# Patient Record
Sex: Male | Born: 1945 | Race: White | Hispanic: No | Marital: Married | Smoking: Former smoker
Health system: Southern US, Community
[De-identification: ages and names within clinical notes are randomized; demographics above are authoritative.]

## PROBLEM LIST (undated history)

## (undated) DIAGNOSIS — Z973 Presence of spectacles and contact lenses: Secondary | ICD-10-CM

## (undated) DIAGNOSIS — C61 Malignant neoplasm of prostate: Secondary | ICD-10-CM

## (undated) DIAGNOSIS — M1990 Unspecified osteoarthritis, unspecified site: Secondary | ICD-10-CM

## (undated) DIAGNOSIS — C4490 Unspecified malignant neoplasm of skin, unspecified: Secondary | ICD-10-CM

## (undated) DIAGNOSIS — J449 Chronic obstructive pulmonary disease, unspecified: Secondary | ICD-10-CM

## (undated) DIAGNOSIS — L4050 Arthropathic psoriasis, unspecified: Secondary | ICD-10-CM

## (undated) DIAGNOSIS — H269 Unspecified cataract: Secondary | ICD-10-CM

## (undated) DIAGNOSIS — Z974 Presence of external hearing-aid: Secondary | ICD-10-CM

## (undated) DIAGNOSIS — C801 Malignant (primary) neoplasm, unspecified: Secondary | ICD-10-CM

## (undated) HISTORY — PX: PROSTATE BIOPSY: SHX241

## (undated) HISTORY — PX: TONSILLECTOMY: SUR1361

## (undated) HISTORY — PX: POLYPECTOMY: SHX149

## (undated) HISTORY — PX: CATARACT EXTRACTION: SUR2

## (undated) HISTORY — DX: Arthropathic psoriasis, unspecified: L40.50

## (undated) HISTORY — DX: Malignant (primary) neoplasm, unspecified: C80.1

## (undated) HISTORY — DX: Unspecified cataract: H26.9

## (undated) HISTORY — DX: Chronic obstructive pulmonary disease, unspecified: J44.9

## (undated) HISTORY — PX: SKIN CANCER EXCISION: SHX779

## (undated) HISTORY — PX: COLONOSCOPY: SHX174

## (undated) HISTORY — DX: Unspecified osteoarthritis, unspecified site: M19.90

---

## 2014-07-08 HISTORY — PX: POLYPECTOMY: SHX149

## 2014-11-25 ENCOUNTER — Telehealth: Payer: Self-pay | Admitting: Internal Medicine

## 2014-12-07 ENCOUNTER — Encounter: Payer: Self-pay | Admitting: Internal Medicine

## 2015-01-24 ENCOUNTER — Ambulatory Visit (AMBULATORY_SURGERY_CENTER): Payer: Self-pay | Admitting: *Deleted

## 2015-01-24 VITALS — Ht 70.0 in | Wt 182.4 lb

## 2015-01-24 DIAGNOSIS — Z86010 Personal history of colonic polyps: Secondary | ICD-10-CM

## 2015-01-24 MED ORDER — NA SULFATE-K SULFATE-MG SULF 17.5-3.13-1.6 GM/177ML PO SOLN: 1.0000 | Freq: Once | ORAL | Status: DC

## 2015-01-24 NOTE — Progress Notes (Signed)
No egg or soy allergy No issues with past sedation No home 02 No diet pills emmi declined Report to : [place] [address] [place]   Dr Othelia Pulling   [phone]

## 2015-02-07 ENCOUNTER — Ambulatory Visit (AMBULATORY_SURGERY_CENTER): Payer: Medicare Other | Admitting: Internal Medicine

## 2015-02-07 ENCOUNTER — Encounter: Payer: Self-pay | Admitting: Internal Medicine

## 2015-02-07 VITALS — BP 107/71 | HR 70 | Temp 97.2°F | Resp 20 | Ht 70.0 in | Wt 182.0 lb

## 2015-02-07 DIAGNOSIS — D129 Benign neoplasm of anus and anal canal: Secondary | ICD-10-CM

## 2015-02-07 DIAGNOSIS — D122 Benign neoplasm of ascending colon: Secondary | ICD-10-CM | POA: Diagnosis not present

## 2015-02-07 DIAGNOSIS — D128 Benign neoplasm of rectum: Secondary | ICD-10-CM

## 2015-02-07 DIAGNOSIS — Z86010 Personal history of colonic polyps: Secondary | ICD-10-CM | POA: Diagnosis present

## 2015-02-07 MED ORDER — SODIUM CHLORIDE 0.9 % IV SOLN: 500.0000 mL | INTRAVENOUS | Status: DC

## 2015-02-07 NOTE — Patient Instructions (Signed)
Discharge instructions given. Handouts on polyps and diverticulosis. Resume previous medications. YOU HAD AN ENDOSCOPIC PROCEDURE TODAY AT THE [facility]:   Refer to the procedure report that was given to you for any specific questions about what was found during the examination.  If the procedure report does not answer your questions, please call your gastroenterologist to clarify.  If you requested that your care partner not be given the details of your procedure findings, then the procedure report has been included in a sealed envelope for you to review at your convenience later.  YOU SHOULD EXPECT: Some feelings of bloating in the abdomen. Passage of more gas than usual.  Walking can help get rid of the air that was put into your GI tract during the procedure and reduce the bloating. If you had a lower endoscopy (such as a colonoscopy or flexible sigmoidoscopy) you may notice spotting of blood in your stool or on the toilet paper. If you underwent a bowel prep for your procedure, you may not have a normal bowel movement for a few days.  Please Note:  You might notice some irritation and congestion in your nose or some drainage.  This is from the oxygen used during your procedure.  There is no need for concern and it should clear up in a day or so.  SYMPTOMS TO REPORT IMMEDIATELY:   Following lower endoscopy (colonoscopy or flexible sigmoidoscopy):  Excessive amounts of blood in the stool  Significant tenderness or worsening of abdominal pains  Swelling of the abdomen that is new, acute  Fever of 100F or higher   For urgent or emergent issues, a gastroenterologist can be reached at any hour by calling [phone].   DIET: Your first meal following the procedure should be a small meal and then it is ok to progress to your normal diet. Heavy or fried foods are harder to digest and may make you feel nauseous or bloated.  Likewise, meals heavy in dairy and vegetables can  increase bloating.  Drink plenty of fluids but you should avoid alcoholic beverages for 24 hours.  ACTIVITY:  You should plan to take it easy for the rest of today and you should NOT DRIVE or use heavy machinery until tomorrow (because of the sedation medicines used during the test).    FOLLOW UP: Our staff will call the number listed on your records the next business day following your procedure to check on you and address any questions or concerns that you may have regarding the information given to you following your procedure. If we do not reach you, we will leave a message.  However, if you are feeling well and you are not experiencing any problems, there is no need to return our call.  We will assume that you have returned to your regular daily activities without incident.  If any biopsies were taken you will be contacted by phone or by letter within the next 1-3 weeks.  Please call us at [phone] if you have not heard about the biopsies in 3 weeks.    SIGNATURES/CONFIDENTIALITY: You and/or your care partner have signed paperwork which will be entered into your electronic medical record.  These signatures attest to the fact that that the information above on your After Visit Summary has been reviewed and is understood.  Full responsibility of the confidentiality of this discharge information lies with you and/or your care-partner. 

## 2015-02-07 NOTE — Progress Notes (Signed)
Transferred to recovery room. A/O x3, pleased with MAC.  VSS.  Report to [place], Therapist, sports.

## 2015-02-07 NOTE — Progress Notes (Signed)
Called to room to assist during endoscopic procedure.  Patient ID and intended procedure confirmed with present staff. Received instructions for my participation in the procedure from the performing physician.  

## 2015-02-07 NOTE — Op Note (Signed)
[place]  [address] [place], [address]   COLONOSCOPY PROCEDURE REPORT  PATIENT: Rodney Martin, Rodney Martin  MR#: [id number] BIRTHDATE: 10-15-1945 , 89  yrs. old GENDER: male ENDOSCOPIST: Eustace Quail, MD REFERRED BY:.Direct Self PROCEDURE DATE:  02/07/2015 PROCEDURE:   Colonoscopy, surveillance and Colonoscopy with snare polypectomy x 6 First Screening Colonoscopy - Avg.  risk and is 50 yrs.  old or older Yes.  Prior Negative Screening - Now for repeat screening. N/A  History of Adenoma - Now for follow-up colonoscopy & has been > or = to 3 yrs.  Yes hx of adenoma.  Has been 3 or more years since last colonoscopy.  Polyps removed today? Yes ASA CLASS:   Class III INDICATIONS:Surveillance due to prior colonic neoplasia and PH Colon Adenoma. Multiple prior colonoscopies (Dr. Algis Greenhouse, [place]) with most recent examinations in 2006 and 2011 with small tubular adenomas. MEDICATIONS: Monitored anesthesia care and Propofol 300 mg IV  DESCRIPTION OF PROCEDURE:   After the risks benefits and alternatives of the procedure were thoroughly explained, informed consent was obtained.  The digital rectal exam revealed no abnormalities of the rectum.   The LB [id number]  endoscope was introduced through the anus and advanced to the cecum, which was identified by both the appendix and ileocecal valve. No adverse events experienced.   The quality of the prep was excellent. (MiraLax was used)  The instrument was then slowly withdrawn as the colon was fully examined. Estimated blood loss is zero unless otherwise noted in this procedure report.  COLON FINDINGS: Six polyps ranging between 2-57mm in size were found in the rectum and ascending colon.  A polypectomy was performed with a cold snare.  The resection was complete, the polyp tissue was completely retrieved and sent to histology.   There was moderate diverticulosis noted in the left colon.   The examination was  otherwise normal.  Retroflexed views revealed internal hemorrhoids. The time to cecum = 2.3 Withdrawal time = 11.6   The scope was withdrawn and the procedure completed. COMPLICATIONS: There were no immediate complications.  ENDOSCOPIC IMPRESSION: 1.   Six polyps were found in the rectum and ascending colon; polypectomy was performed with a cold snare 2.   Moderate diverticulosis was noted in the left colon 3.   The examination was otherwise normal  RECOMMENDATIONS: 1. Repeat Colonoscopy in 3 years.  eSigned:  Eustace Quail, MD 02/07/2015 10:25 AM   cc: The Patient

## 2015-02-08 ENCOUNTER — Telehealth: Payer: Self-pay | Admitting: *Deleted

## 2015-02-08 NOTE — Telephone Encounter (Signed)
  Follow up Call-  Call back number 02/07/2015  Post procedure Call Back phone  # [phone]  Permission to leave phone message Yes     Patient questions:  Do you have a fever, pain , or abdominal swelling? No. Pain Score  0 *0  Have you tolerated food without any problems? Yes.    Have you been able to return to your normal activities? Yes.    Do you have any questions about your discharge instructions: Diet   No. Medications             NO Follow up visit  No.  Do you have questions or concerns about your Care? No.  Actions: * If pain score is 4 or above: No action needed, pain <4.

## 2015-02-21 ENCOUNTER — Encounter: Payer: Self-pay | Admitting: Internal Medicine

## 2015-02-27 ENCOUNTER — Telehealth: Payer: Self-pay | Admitting: Internal Medicine

## 2015-02-27 NOTE — Telephone Encounter (Signed)
Results letter reviewed with pts wife.

## 2015-04-24 NOTE — Telephone Encounter (Signed)
Appt. Scheduled.

## 2018-01-14 ENCOUNTER — Encounter: Payer: Self-pay | Admitting: Internal Medicine

## 2018-03-10 ENCOUNTER — Ambulatory Visit (AMBULATORY_SURGERY_CENTER): Payer: Self-pay

## 2018-03-10 ENCOUNTER — Other Ambulatory Visit: Payer: Self-pay

## 2018-03-10 ENCOUNTER — Encounter: Payer: Self-pay | Admitting: Internal Medicine

## 2018-03-10 VITALS — Ht 70.0 in | Wt 181.0 lb

## 2018-03-10 DIAGNOSIS — Z86010 Personal history of colonic polyps: Secondary | ICD-10-CM

## 2018-03-10 MED ORDER — NA SULFATE-K SULFATE-MG SULF 17.5-3.13-1.6 GM/177ML PO SOLN: 1.0000 | Freq: Once | ORAL | 0 refills | Status: AC

## 2018-03-10 NOTE — Progress Notes (Signed)
No egg or soy allergy known to patient  No issues with past sedation with any surgeries  or procedures, no intubation problems  No diet pills per patient No home 02 use per patient  No blood thinners per patient  Pt denies issues with constipation  No A fib or A flutter  EMMI video sent to pt's e mail , pt declined    

## 2018-03-23 ENCOUNTER — Ambulatory Visit (AMBULATORY_SURGERY_CENTER): Payer: Medicare Other | Admitting: Internal Medicine

## 2018-03-23 ENCOUNTER — Encounter: Payer: Self-pay | Admitting: Internal Medicine

## 2018-03-23 VITALS — BP 90/51 | HR 72 | Temp 98.6°F | Resp 15 | Ht 70.0 in | Wt 181.0 lb

## 2018-03-23 DIAGNOSIS — Z86010 Personal history of colonic polyps: Secondary | ICD-10-CM | POA: Diagnosis present

## 2018-03-23 DIAGNOSIS — D122 Benign neoplasm of ascending colon: Secondary | ICD-10-CM

## 2018-03-23 MED ORDER — SODIUM CHLORIDE 0.9 % IV SOLN: 500.0000 mL | Freq: Once | INTRAVENOUS | Status: DC

## 2018-03-23 NOTE — Progress Notes (Signed)
Report to PACU, RN, vss, BBS= Clear.  

## 2018-03-23 NOTE — Progress Notes (Signed)
Pt. Reports no change in his medical or surgical history since his pre-visit 03/10/2018.

## 2018-03-23 NOTE — Patient Instructions (Signed)
YOU HAD AN ENDOSCOPIC PROCEDURE TODAY AT THE [facility]:   Refer to the procedure report that was given to you for any specific questions about what was found during the examination.  If the procedure report does not answer your questions, please call your gastroenterologist to clarify.  If you requested that your care partner not be given the details of your procedure findings, then the procedure report has been included in a sealed envelope for you to review at your convenience later.  YOU SHOULD EXPECT: Some feelings of bloating in the abdomen. Passage of more gas than usual.  Walking can help get rid of the air that was put into your GI tract during the procedure and reduce the bloating. If you had a lower endoscopy (such as a colonoscopy or flexible sigmoidoscopy) you may notice spotting of blood in your stool or on the toilet paper. If you underwent a bowel prep for your procedure, you may not have a normal bowel movement for a few days.  Please Note:  You might notice some irritation and congestion in your nose or some drainage.  This is from the oxygen used during your procedure.  There is no need for concern and it should clear up in a day or so.  SYMPTOMS TO REPORT IMMEDIATELY:   Following lower endoscopy (colonoscopy or flexible sigmoidoscopy):  Excessive amounts of blood in the stool  Significant tenderness or worsening of abdominal pains  Swelling of the abdomen that is new, acute  Fever of 100F or higher  For urgent or emergent issues, a gastroenterologist can be reached at any hour by calling [phone].   DIET:  We do recommend a small meal at first, but then you may proceed to your regular diet.  Drink plenty of fluids but you should avoid alcoholic beverages for 24 hours.  MEDICATIONS: Continue present medications.  Please see handouts given to you by your recovery nurse.  ACTIVITY:  You should plan to take it easy for the rest of today and you should NOT  DRIVE or use heavy machinery until tomorrow (because of the sedation medicines used during the test).    FOLLOW UP: Our staff will call the number listed on your records the next business day following your procedure to check on you and address any questions or concerns that you may have regarding the information given to you following your procedure. If we do not reach you, we will leave a message.  However, if you are feeling well and you are not experiencing any problems, there is no need to return our call.  We will assume that you have returned to your regular daily activities without incident.  If any biopsies were taken you will be contacted by phone or by letter within the next 1-3 weeks.  Please call us at [phone] if you have not heard about the biopsies in 3 weeks.   Thank you for allowing us to provide for your healthcare needs today.   SIGNATURES/CONFIDENTIALITY: You and/or your care partner have signed paperwork which will be entered into your electronic medical record.  These signatures attest to the fact that that the information above on your After Visit Summary has been reviewed and is understood.  Full responsibility of the confidentiality of this discharge information lies with you and/or your care-partner. 

## 2018-03-23 NOTE — Op Note (Signed)
[facility] Patient Name: Rodney Martin Procedure Date: 03/23/2018 8:01 AM MRN: [id number] Endoscopist: Docia Chuck. Henrene Pastor , MD Age: 72 Referring MD:  Date of Birth: 08-19-1945 Gender: Male Account #: [id number] Procedure:                Colonoscopy, With cold snare polypectomy x 1 Indications:              High risk colon cancer surveillance: Personal                            history of multiple (3 or more) adenomas. Previous                            examinations 2006 and 2011 (elsewhere) and 2016 Medicines:                Monitored Anesthesia Care Procedure:                Pre-Anesthesia Assessment:                           - Prior to the procedure, a History and Physical                            was performed, and patient medications and                            allergies were reviewed. The patient's tolerance of                            previous anesthesia was also reviewed. The risks                            and benefits of the procedure and the sedation                            options and risks were discussed with the patient.                            All questions were answered, and informed consent                            was obtained. Prior Anticoagulants: The patient has                            taken no previous anticoagulant or antiplatelet                            agents. ASA Grade Assessment: II - A patient with                            mild systemic disease. After reviewing the risks                            and benefits, the patient was deemed in  satisfactory condition to undergo the procedure.                           After obtaining informed consent, the colonoscope                            was passed under direct vision. Throughout the                            procedure, the patient's blood pressure, pulse, and                            oxygen saturations were monitored continuously. The                Colonoscope was introduced through the anus and                            advanced to the the cecum, identified by                            appendiceal orifice and ileocecal valve. The                            ileocecal valve, appendiceal orifice, and rectum                            were photographed. The quality of the bowel                            preparation was excellent. The colonoscopy was                            performed without difficulty. The patient tolerated                            the procedure well. The bowel preparation used was                            SUPREP. Scope In: 8:11:48 AM Scope Out: 8:23:57 AM Scope Withdrawal Time: 0 hours 9 minutes 37 seconds  Total Procedure Duration: 0 hours 12 minutes 9 seconds  Findings:                 A 3 mm polyp was found in the ascending colon. The                            polyp was removed with a cold snare. Resection and                            retrieval were complete.                           Multiple diverticula were found in the left colon.  Internal hemorrhoids were found during retroflexion.                           The exam was otherwise without abnormality on                            direct and retroflexion views. Complications:            No immediate complications. Estimated blood loss:                            None. Estimated Blood Loss:     Estimated blood loss: none. Impression:               - One 3 mm polyp in the ascending colon, removed                            with a cold snare. Resected and retrieved.                           - Diverticulosis in the left colon.                           - Internal hemorrhoids.                           - The examination was otherwise normal on direct                            and retroflexion views. Recommendation:           - Repeat colonoscopy in 5 years for surveillance.                           - Patient  has a contact number available for                            emergencies. The signs and symptoms of potential                            delayed complications were discussed with the                            patient. Return to normal activities tomorrow.                            Written discharge instructions were provided to the                            patient.                           - Resume previous diet.                           - Continue present medications.                           -  Await pathology results. Docia Chuck. Henrene Pastor, MD 03/23/2018 8:38:00 AM This report has been signed electronically.

## 2018-03-23 NOTE — Progress Notes (Signed)
Called to room to assist during endoscopic procedure.  Patient ID and intended procedure confirmed with present staff. Received instructions for my participation in the procedure from the performing physician.  

## 2018-03-24 ENCOUNTER — Telehealth: Payer: Self-pay | Admitting: *Deleted

## 2018-03-24 NOTE — Telephone Encounter (Signed)
  Follow up Call-  Call back number 03/23/2018  Post procedure Call Back phone  # #[phone] home  Permission to leave phone message Yes  Some recent data might be hidden     Patient questions:  Do you have a fever, pain , or abdominal swelling? No. Pain Score  0 *  Have you tolerated food without any problems? Yes.    Have you been able to return to your normal activities? Yes.    Do you have any questions about your discharge instructions: Diet   No. Medications  No. Follow up visit  No.  Do you have questions or concerns about your Care? No.  Actions: * If pain score is 4 or above: No action needed, pain <4.

## 2018-03-30 ENCOUNTER — Encounter: Payer: Self-pay | Admitting: Internal Medicine

## 2020-07-03 NOTE — Progress Notes (Signed)
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 Marcine Matar Transrectal ultrasound of prostate with prostate needle biopsy  Past/Anticipated interventions by medical oncology, if any:  No referral placed at this time  Weight changes, if any: denies  Bowel/Bladder complaints, if any: IPSS 5. SHIM 1 due to no sexual activity. Denies dysuria, hematuria, urinary leakage or incontinence.    Nausea/Vomiting, if any: denies  Pain issues, if any:  denies  SAFETY ISSUES:  Prior radiation? denies  Pacemaker/ICD? No  Possible current pregnancy? N/A  Is the patient on methotrexate? denies  Current Complaints / other details: 74 year old male. Strong family hx of cancer.  History of skin cancer

## 2020-07-04 ENCOUNTER — Other Ambulatory Visit: Payer: Self-pay

## 2020-07-04 ENCOUNTER — Encounter: Payer: Self-pay | Admitting: Radiation Oncology

## 2020-07-04 ENCOUNTER — Ambulatory Visit
Admission: RE | Admit: 2020-07-04 | Discharge: 2020-07-04 | Disposition: A | Discharge status: Home / Self Care | Payer: Medicare Other | Source: Ambulatory Visit | Attending: Radiation Oncology | Admitting: Radiation Oncology

## 2020-07-04 ENCOUNTER — Encounter: Payer: Self-pay | Admitting: Medical Oncology

## 2020-07-04 VITALS — BP 116/77 | HR 91 | Temp 97.6°F | Resp 18 | Ht 70.0 in | Wt 194.4 lb

## 2020-07-04 DIAGNOSIS — Z87891 Personal history of nicotine dependence: Secondary | ICD-10-CM | POA: Insufficient documentation

## 2020-07-04 DIAGNOSIS — J449 Chronic obstructive pulmonary disease, unspecified: Secondary | ICD-10-CM | POA: Insufficient documentation

## 2020-07-04 DIAGNOSIS — Z800 Family history of malignant neoplasm of digestive organs: Secondary | ICD-10-CM | POA: Diagnosis not present

## 2020-07-04 DIAGNOSIS — C61 Malignant neoplasm of prostate: Secondary | ICD-10-CM | POA: Diagnosis not present

## 2020-07-04 DIAGNOSIS — M129 Arthropathy, unspecified: Secondary | ICD-10-CM | POA: Diagnosis not present

## 2020-07-04 DIAGNOSIS — Z803 Family history of malignant neoplasm of breast: Secondary | ICD-10-CM | POA: Diagnosis not present

## 2020-07-04 HISTORY — DX: Malignant neoplasm of prostate: C61

## 2020-07-04 HISTORY — DX: Unspecified malignant neoplasm of skin, unspecified: C44.90

## 2020-07-04 NOTE — Progress Notes (Signed)
See progress note under physician encounter. 

## 2020-07-04 NOTE — Progress Notes (Signed)
Radiation Oncology         [phone] ________________________________  Initial outpatient Consultation  Name: Rodney Martin MRN: [id number]  Date: 07/04/2020  DOB: 1946-04-12  XB:[id number], Tilden Fossa, MD  Franchot Gallo, MD   REFERRING PHYSICIAN: Franchot Gallo, MD  DIAGNOSIS: 74 y.o. gentleman with Stage T1c adenocarcinoma of the prostate with Gleason score of 3+4, and PSA of 5.87.    ICD-10-CM   1. Malignant neoplasm of prostate ([place])  C61     HISTORY OF PRESENT ILLNESS: Rodney Martin is a 74 y.o. male with a diagnosis of prostate cancer. He was noted to have an elevated PSA of 5.02 by his primary care physician, Dr. Shon Millet.  Accordingly, he was referred for evaluation in urology by Dr. Diona Fanti on 05/01/2020,  digital rectal examination was performed at that time revealing no nodules.  Repeat PSA from that day remained elevated at 5.87.  The patient proceeded to transrectal ultrasound with 12 biopsies of the prostate on 05/22/2020.  The prostate volume measured 34.44 cc.  Out of 12 core biopsies, 2 were positive.  The maximum Gleason score was 3+4, and this was seen in the left base lateral (with cribiform glands) and left mid.  The patient reviewed the biopsy results with his urologist and he has kindly been referred today for discussion of potential radiation treatment options.   PREVIOUS RADIATION THERAPY: No  PAST MEDICAL HISTORY:  Past Medical History:  Diagnosis Date   Arthritis    Cataract    bilateral and removed   COPD (chronic obstructive pulmonary disease) (HCC)    Prostate cancer (HCC)    Psoriatic arthritis ([place])    Skin cancer       PAST SURGICAL HISTORY: Past Surgical History:  Procedure Laterality Date   CATARACT EXTRACTION Bilateral    COLONOSCOPY     POLYPECTOMY     PROSTATE BIOPSY     SKIN CANCER EXCISION     multiple sites   TONSILLECTOMY      FAMILY HISTORY:  Family History  Problem Relation Age of Onset   Colon  cancer Brother    Breast cancer Sister    Pancreatic cancer Brother    Colon polyps Neg Hx    Rectal cancer Neg Hx    Stomach cancer Neg Hx    Esophageal cancer Neg Hx     SOCIAL HISTORY:  Social History   Socioeconomic History   Marital status: Married    Spouse name: Not on file   Number of children: Not on file   Years of education: Not on file   Highest education level: Not on file  Occupational History   Not on file  Tobacco Use   Smoking status: Former Smoker    Packs/day: 1.00    Types: Cigarettes    Quit date: 08/2018    Years since quitting: 1.9   Smokeless tobacco: Never Used  Vaping Use   Vaping Use: Never used  Substance and Sexual Activity   Alcohol use: No    Alcohol/week: 0.0 standard drinks   Drug use: No   Sexual activity: Not Currently  Other Topics Concern   Not on file  Social History Narrative   Not on file   Social Determinants of Health   Financial Resource Strain: Not on file  Food Insecurity: Not on file  Transportation Needs: Not on file  Physical Activity: Not on file  Stress: Not on file  Social Connections: Not on file  Intimate Partner Violence:  Not on file    ALLERGIES: Remicade [infliximab], Aspirin, Humira [adalimumab], Bacitracin-polymyxin b, and Naproxen  MEDICATIONS:  Current Outpatient Medications  Medication Sig Dispense Refill   calcium carbonate (OS-CAL) 600 MG TABS tablet Take 600 mg by mouth 2 (two) times daily with a meal.     cholecalciferol (VITAMIN D) 1000 UNITS tablet Take 2,000 Units by mouth daily.     denosumab (PROLIA) 60 MG/ML SOSY injection Inject 60 mg into the skin every 6 (six) months.     Glucosamine-Chondroitin 1500-1200 MG/30ML LIQD      Multiple Vitamin (MULTIVITAMIN) tablet Take 1 tablet by mouth daily.     Omega-3 Fatty Acids (FISH OIL) 1000 MG CAPS Take by mouth.     pimecrolimus (ELIDEL) 1 % cream Apply topically.     ustekinumab (STELARA) 45 MG/0.5ML SOSY injection  Inject 45 mg into the skin. Every 12 weeks     augmented betamethasone dipropionate (DIPROLENE-AF) 0.05 % cream  (Patient not taking: Reported on 07/04/2020)     Current Facility-Administered Medications  Medication Dose Route Frequency Provider Last Rate Last Admin   0.9 %  sodium chloride infusion  500 mL Intravenous Once Irene Shipper, MD        REVIEW OF SYSTEMS:  On review of systems, the patient reports that he is doing well overall. He denies any chest pain, shortness of breath, cough, fevers, chills, night sweats, unintended weight changes. He denies any bowel disturbances, and denies abdominal pain, nausea or vomiting. He denies any new musculoskeletal or joint aches or pains. His IPSS was 5, indicating mild urinary symptoms. His SHIM was 1 due to inactivity, indicating he may or may not have erectile dysfunction. A complete review of systems is obtained and is otherwise negative.    PHYSICAL EXAM:  Wt Readings from Last 3 Encounters:  07/04/20 194 lb 6.4 oz (88.2 kg)  03/23/18 181 lb (82.1 kg)  03/10/18 181 lb (82.1 kg)   Temp Readings from Last 3 Encounters:  07/04/20 97.6 F (36.4 C)  03/23/18 98.6 F (37 C)  02/07/15 97.2 F (36.2 C) (Tympanic)   BP Readings from Last 3 Encounters:  07/04/20 116/77  03/23/18 (!) 90/51  02/07/15 107/71   Pulse Readings from Last 3 Encounters:  07/04/20 91  03/23/18 72  02/07/15 70    /10  In general this is a well appearing Caucasian man in no acute distress. He's alert and oriented x4 and appropriate throughout the examination. Cardiopulmonary assessment is negative for acute distress, and he exhibits normal effort.     KPS = 90  100 - Normal; no complaints; no evidence of disease. 90   - Able to carry on normal activity; minor signs or symptoms of disease. 80   - Normal activity with effort; some signs or symptoms of disease. 67   - Cares for self; unable to carry on normal activity or to do active work. 60   - Requires  occasional assistance, but is able to care for most of his personal needs. 50   - Requires considerable assistance and frequent medical care. 41   - Disabled; requires special care and assistance. 46   - Severely disabled; hospital admission is indicated although death not imminent. 100   - Very sick; hospital admission necessary; active supportive treatment necessary. 10   - Moribund; fatal processes progressing rapidly. 0     - Dead  Karnofsky DA, Abelmann WH, Craver LS and Burchenal [facility] [phone]) The use of the nitrogen  mustards in the palliative treatment of carcinoma: with particular reference to bronchogenic carcinoma Cancer 1 [phone]  LABORATORY DATA:  No results found for: WBC, HGB, HCT, MCV, PLT No results found for: NA, K, CL, CO2 No results found for: ALT, AST, GGT, ALKPHOS, BILITOT   RADIOGRAPHY: No results found.    IMPRESSION/PLAN: 1. 74 y.o. gentleman with Stage T1c adenocarcinoma of the prostate with Gleason Score of 3+4, and PSA of 5.87. We discussed the patient's workup and outlined the nature of prostate cancer in this setting. The patient's T stage, Gleason's score, and PSA put him into the favorable intermediate risk group. Accordingly, he is eligible for a variety of potential treatment options including brachytherapy, 5.5 weeks of external radiation, or prostatectomy. We discussed the available radiation techniques, and focused on the details and logistics of delivery. We discussed and outlined the risks, benefits, short and long-term effects associated with radiotherapy and compared and contrasted these with prostatectomy. We discussed the role of SpaceOAR gel in reducing the rectal toxicity associated with radiotherapy.  He appears to have a good understanding of his disease and our treatment recommendations which are of curative intent.  He was encouraged to ask questions that were answered to his stated satisfaction.  At the conclusion of our conversation, the patient is  interested in moving forward with brachytherapy and use of SpaceOAR gel to reduce rectal toxicity from radiotherapy.  We will share our discussion with Dr. Diona Fanti and move forward with scheduling his CT [facility] planning appointment in the near future.  The patient met briefly with Romie Jumper in our office who will be working closely with him to coordinate OR scheduling and pre and post procedure appointments.  We will contact the pharmaceutical rep to ensure that [employer] is available at the time of procedure.  We enjoyed meeting him today and look forward to continuing to participate in his care.  Of note, he is followed by Dr. Amil Amen at [facility] rheumatology for psoriatic arthritis and states that he is due for his next [facility] injection on July 19, 2020. We assured him that this will not interfere with his brachytherapy procedure which will likely be scheduled for mid-late Feb. 2022 but that we would recommend that he skip the treatment in February and will defer to his rheumatologist regarding recommendation for resuming treatment after his seed implant procedure.    Nicholos Johns, PA-C    Tyler Pita, MD  [facility] Direct Dial: [phone]   Fax: [phone] [email or web address]   Skype   [email or web address]   This document serves as a record of services personally performed by Tyler Pita, MD and Freeman Caldron, PA-C. It was created on their behalf by Wilburn Mylar, a trained medical scribe. The creation of this record is based on the scribe's personal observations and the provider's statements to them. This document has been checked and approved by the attending provider.

## 2020-07-11 ENCOUNTER — Telehealth: Payer: Self-pay | Admitting: *Deleted

## 2020-07-11 NOTE — Telephone Encounter (Signed)
CALLED PATIENT TO UPDATE, LVM FOR A RETURN CALL 

## 2020-07-12 ENCOUNTER — Telehealth: Payer: Self-pay | Admitting: *Deleted

## 2020-07-12 NOTE — Telephone Encounter (Signed)
RETURNED PATIENT'S PHONE CALL, SPOKE WITH PATIENT. ?

## 2020-07-20 ENCOUNTER — Telehealth: Payer: Self-pay | Admitting: *Deleted

## 2020-07-20 NOTE — Telephone Encounter (Signed)
Called patient to inform of pre-seed appts. and implant, lvm for a return call 

## 2020-07-26 ENCOUNTER — Other Ambulatory Visit: Payer: Self-pay | Admitting: Urology

## 2020-08-07 ENCOUNTER — Telehealth: Payer: Self-pay | Admitting: *Deleted

## 2020-08-07 NOTE — Telephone Encounter (Signed)
RETURNED PATIENT'S PHONE CALL, SPOKE WITH PATIENT. ?

## 2020-08-09 ENCOUNTER — Telehealth: Payer: Self-pay | Admitting: *Deleted

## 2020-08-09 NOTE — Telephone Encounter (Signed)
CALLED PATIENT TO REMIND OF PRE-SEED APPTS. FOR 08-10-20, SPOKE WITH PATIENT AND HE IS AWARE OF THESE APPTS.

## 2020-08-10 ENCOUNTER — Encounter (HOSPITAL_COMMUNITY)
Admission: RE | Admit: 2020-08-10 | Discharge: 2020-08-10 | Disposition: A | Discharge status: Home / Self Care | Payer: Medicare Other | Source: Ambulatory Visit | Attending: Urology | Admitting: Urology

## 2020-08-10 ENCOUNTER — Ambulatory Visit
Admission: RE | Admit: 2020-08-10 | Discharge: 2020-08-10 | Disposition: A | Discharge status: Home / Self Care | Payer: Medicare Other | Source: Ambulatory Visit | Attending: Urology | Admitting: Urology

## 2020-08-10 ENCOUNTER — Ambulatory Visit
Admission: RE | Admit: 2020-08-10 | Discharge: 2020-08-10 | Disposition: A | Discharge status: Home / Self Care | Payer: Medicare Other | Source: Ambulatory Visit | Attending: Radiation Oncology | Admitting: Radiation Oncology

## 2020-08-10 ENCOUNTER — Ambulatory Visit (HOSPITAL_COMMUNITY)
Admission: RE | Admit: 2020-08-10 | Discharge: 2020-08-10 | Disposition: A | Discharge status: Home / Self Care | Payer: Medicare Other | Source: Ambulatory Visit | Attending: Urology | Admitting: Urology

## 2020-08-10 ENCOUNTER — Encounter: Payer: Self-pay | Admitting: Medical Oncology

## 2020-08-10 ENCOUNTER — Other Ambulatory Visit: Payer: Self-pay

## 2020-08-10 DIAGNOSIS — Z01818 Encounter for other preprocedural examination: Secondary | ICD-10-CM | POA: Diagnosis present

## 2020-08-10 DIAGNOSIS — C61 Malignant neoplasm of prostate: Secondary | ICD-10-CM | POA: Diagnosis present

## 2020-08-10 NOTE — Progress Notes (Signed)
  Radiation Oncology         [phone] ________________________________  Name: DUTCH ING MRN: [id number]  Date: 08/10/2020  DOB: Apr 05, 1946  SIMULATION AND TREATMENT PLANNING NOTE PUBIC ARCH STUDY  HW:[id number], Tilden Fossa, MD  Franchot Gallo, MD  DIAGNOSIS: 75 y.o. gentleman with Stage T1c adenocarcinoma of the prostate with Gleason score of 3+4, and PSA of 5.87.  Oncology History  Malignant neoplasm of prostate ([place])  05/22/2020 Cancer Staging   Staging form: Prostate, AJCC 8th Edition - Clinical stage from 05/22/2020: Stage IIB (cT1c, cN0, cM0, PSA: 5.9, Grade Group: 2) - Signed by Freeman Caldron, PA-C on 07/04/2020   07/04/2020 Initial Diagnosis   Malignant neoplasm of prostate ([place])       ICD-10-CM   1. Malignant neoplasm of prostate ([place])  C61     COMPLEX SIMULATION:  The patient presented today for evaluation for possible prostate seed implant. He was brought to the radiation planning suite and placed supine on the CT couch. A 3-dimensional image study set was obtained in upload to the planning computer. There, on each axial slice, I contoured the prostate gland. Then, using three-dimensional radiation planning tools I reconstructed the prostate in view of the structures from the transperineal needle pathway to assess for possible pubic arch interference. In doing so, I did not appreciate any pubic arch interference. Also, the patient's prostate volume was estimated based on the drawn structure. The volume was 35 cc.  Given the pubic arch appearance and prostate volume, patient remains a good candidate to proceed with prostate seed implant. Today, he freely provided informed written consent to proceed.    PLAN: The patient will undergo prostate seed implant.   ________________________________  Sheral Apley. Tammi Klippel, M.D.

## 2020-08-11 NOTE — Progress Notes (Signed)
Faxed chest xray results to [facility] attention selita, left voice mail message for selits staing chest xray results from 08-10-2020 faxed to medical records and fax confirmation received, please fax me a copy if dr dahlstedt orders a chest ct

## 2020-09-18 NOTE — Progress Notes (Signed)
Left voicemail message with selita to please send me any chest ct results  or notes dr Diona Fanti has concerning abnormal chest xray results faxed to dr dahlstedt on 08-10-2020.

## 2020-09-18 NOTE — Progress Notes (Addendum)
Routed chest ray results to dr Diona Fanti via epic and left message with shirley for dr Tammi Klippel to look at chest xray results

## 2020-09-19 ENCOUNTER — Other Ambulatory Visit: Payer: Self-pay

## 2020-09-19 ENCOUNTER — Encounter (HOSPITAL_BASED_OUTPATIENT_CLINIC_OR_DEPARTMENT_OTHER): Payer: Self-pay | Admitting: Urology

## 2020-09-19 NOTE — Progress Notes (Signed)
Spoke w/ via phone for pre-op interview---pt Lab needs dos----none             Lab results------has lab appointment 09-22-2020 1100 for cbc cmp pt ptt COVID test ------09-22-2020 1230 Arrive at -------900 am 09-25-2020 NPO after MN NO Solid Food.  Clear liquids from MN until---800 am then npo Med rec completed Medications to take morning of surgery ----- none Diabetic medication -----n/a Patient instructed to bring /a photo id and insurance card day of surgery Patient aware to have Driver (ride ) / caregiver wife linda will stay    for 24 hours after surgery  Patient Special Instructions -----fleets enema am of surgery Pre-Op special Istructions -----none Patient verbalized understanding of instructions that were given at this phone interview. Patient denies shortness of breath, chest pain, fever, cough at this phone interview.  Chest  xRay 08-10-2020 epic, chest ct 08-30-2020 on chart (ordered by dr Diona Fanti after abnormal chest ray done 08-10-2020 ekg 08-10-2020 epic

## 2020-09-19 NOTE — Progress Notes (Signed)
Left voice mail message with selita  abormal chest xray results from 08-10-2020 sent to dr dahlstedt 08-10-2020 and 08-21-2020 to please return my call or fax dr dahlstedt response to [phone]

## 2020-09-20 ENCOUNTER — Telehealth: Payer: Self-pay | Admitting: *Deleted

## 2020-09-20 NOTE — Telephone Encounter (Signed)
CALLED PATIENT TO REMIND OF LABS AND COVID TESTING FOR 09-22-20, SPOKE WITH PATIENT'S WIFE- LINDA AND THEY ARE AWARE OF THESE APPTS.

## 2020-09-22 ENCOUNTER — Other Ambulatory Visit (HOSPITAL_COMMUNITY)
Admission: RE | Admit: 2020-09-22 | Discharge: 2020-09-22 | Disposition: A | Discharge status: Home / Self Care | Payer: Medicare Other | Source: Ambulatory Visit | Attending: Urology | Admitting: Urology

## 2020-09-22 ENCOUNTER — Encounter (HOSPITAL_COMMUNITY)
Admission: RE | Admit: 2020-09-22 | Discharge: 2020-09-22 | Disposition: A | Discharge status: Home / Self Care | Payer: Medicare Other | Source: Ambulatory Visit | Attending: Urology | Admitting: Urology

## 2020-09-22 ENCOUNTER — Telehealth: Payer: Self-pay | Admitting: *Deleted

## 2020-09-22 ENCOUNTER — Other Ambulatory Visit: Payer: Self-pay

## 2020-09-22 DIAGNOSIS — Z20822 Contact with and (suspected) exposure to covid-19: Secondary | ICD-10-CM | POA: Diagnosis not present

## 2020-09-22 DIAGNOSIS — Z01812 Encounter for preprocedural laboratory examination: Secondary | ICD-10-CM | POA: Diagnosis present

## 2020-09-22 LAB — CBC
HCT: 44.7 % (ref 39.0–52.0)
Hemoglobin: 14.4 g/dL (ref 13.0–17.0)
MCH: 28.7 pg (ref 26.0–34.0)
MCHC: 32.2 g/dL (ref 30.0–36.0)
MCV: 89 fL (ref 80.0–100.0)
Platelets: 243 10*3/uL (ref 150–400)
RBC: 5.02 MIL/uL (ref 4.22–5.81)
RDW: 13.2 % (ref 11.5–15.5)
WBC: 8 10*3/uL (ref 4.0–10.5)
nRBC: 0 % (ref 0.0–0.2)

## 2020-09-22 LAB — COMPREHENSIVE METABOLIC PANEL
ALT: 15 U/L (ref 0–44)
AST: 16 U/L (ref 15–41)
Albumin: 4.2 g/dL (ref 3.5–5.0)
Alkaline Phosphatase: 52 U/L (ref 38–126)
Anion gap: 7 (ref 5–15)
BUN: 18 mg/dL (ref 8–23)
CO2: 24 mmol/L (ref 22–32)
Calcium: 9.5 mg/dL (ref 8.9–10.3)
Chloride: 107 mmol/L (ref 98–111)
Creatinine, Ser: 1.08 mg/dL (ref 0.61–1.24)
GFR, Estimated: >60 mL/min (ref >60)
Glucose, Bld: 101 mg/dL — ABNORMAL HIGH (ref 70–99)
Potassium: 4.6 mmol/L (ref 3.5–5.1)
Sodium: 138 mmol/L (ref 135–145)
Total Bilirubin: 0.6 mg/dL (ref 0.3–1.2)
Total Protein: 7.5 g/dL (ref 6.5–8.1)

## 2020-09-22 LAB — PROTIME-INR
INR: 1 (ref 0.8–1.2)
Prothrombin Time: 13 seconds (ref 11.4–15.2)

## 2020-09-22 LAB — APTT: aPTT: 32 seconds (ref 24–36)

## 2020-09-22 NOTE — Telephone Encounter (Signed)
CALLED PATIENT TO REMIND OF IMPLANT FOR 09-25-20, LVM FOR A RETURN CALL

## 2020-09-23 LAB — SARS CORONAVIRUS 2 (TAT 6-24 HRS): SARS Coronavirus 2: NEGATIVE

## 2020-09-25 ENCOUNTER — Ambulatory Visit (HOSPITAL_BASED_OUTPATIENT_CLINIC_OR_DEPARTMENT_OTHER)
Admission: RE | Admit: 2020-09-25 | Discharge: 2020-09-25 | Disposition: A | Discharge status: Home / Self Care | Payer: Medicare Other | Attending: Urology | Admitting: Urology

## 2020-09-25 ENCOUNTER — Ambulatory Visit (HOSPITAL_COMMUNITY): Payer: Medicare Other

## 2020-09-25 ENCOUNTER — Encounter (HOSPITAL_BASED_OUTPATIENT_CLINIC_OR_DEPARTMENT_OTHER): Payer: Self-pay | Admitting: Urology

## 2020-09-25 ENCOUNTER — Encounter (HOSPITAL_BASED_OUTPATIENT_CLINIC_OR_DEPARTMENT_OTHER)
Admission: RE | Disposition: A | Discharge status: Home / Self Care | Payer: Self-pay | Source: Home / Self Care | Attending: Urology

## 2020-09-25 ENCOUNTER — Ambulatory Visit (HOSPITAL_BASED_OUTPATIENT_CLINIC_OR_DEPARTMENT_OTHER): Payer: Medicare Other | Admitting: Anesthesiology

## 2020-09-25 DIAGNOSIS — Z803 Family history of malignant neoplasm of breast: Secondary | ICD-10-CM | POA: Insufficient documentation

## 2020-09-25 DIAGNOSIS — Z886 Allergy status to analgesic agent status: Secondary | ICD-10-CM | POA: Diagnosis not present

## 2020-09-25 DIAGNOSIS — C61 Malignant neoplasm of prostate: Secondary | ICD-10-CM | POA: Insufficient documentation

## 2020-09-25 DIAGNOSIS — Z888 Allergy status to other drugs, medicaments and biological substances status: Secondary | ICD-10-CM | POA: Insufficient documentation

## 2020-09-25 DIAGNOSIS — Z87891 Personal history of nicotine dependence: Secondary | ICD-10-CM | POA: Diagnosis not present

## 2020-09-25 HISTORY — PX: SPACE OAR INSTILLATION: SHX6769

## 2020-09-25 HISTORY — DX: Presence of spectacles and contact lenses: Z97.3

## 2020-09-25 HISTORY — DX: Presence of external hearing-aid: Z97.4

## 2020-09-25 HISTORY — PX: RADIOACTIVE SEED IMPLANT: SHX5150

## 2020-09-25 SURGERY — INSERTION, RADIATION SOURCE, PROSTATE
Anesthesia: General | Site: Prostate

## 2020-09-25 MED ORDER — ONDANSETRON HCL 4 MG/2ML IJ SOLN
INTRAMUSCULAR | Status: AC
  Filled 2020-09-25: qty 2

## 2020-09-25 MED ORDER — FENTANYL CITRATE (PF) 100 MCG/2ML IJ SOLN
INTRAMUSCULAR | Status: AC
  Filled 2020-09-25: qty 2

## 2020-09-25 MED ORDER — CEFAZOLIN SODIUM-DEXTROSE 2-4 GM/100ML-% IV SOLN
2.0000 g | Freq: Once | INTRAVENOUS | Status: AC
  Administered 2020-09-25: 2 g via INTRAVENOUS

## 2020-09-25 MED ORDER — IOHEXOL 300 MG/ML  SOLN
INTRAMUSCULAR | Status: DC | PRN
  Administered 2020-09-25: 7 mL

## 2020-09-25 MED ORDER — FLEET ENEMA 7-19 GM/118ML RE ENEM: 1.0000 | ENEMA | Freq: Once | RECTAL | Status: DC

## 2020-09-25 MED ORDER — ONDANSETRON HCL 4 MG/2ML IJ SOLN
INTRAMUSCULAR | Status: DC | PRN
  Administered 2020-09-25: 4 mg via INTRAVENOUS

## 2020-09-25 MED ORDER — SODIUM CHLORIDE 0.9 % IV SOLN
INTRAVENOUS | Status: AC | PRN
  Administered 2020-09-25: 1000 mL

## 2020-09-25 MED ORDER — OXYCODONE HCL 5 MG/5ML PO SOLN: 5.0000 mg | Freq: Once | ORAL | Status: DC | PRN

## 2020-09-25 MED ORDER — STERILE WATER FOR IRRIGATION IR SOLN
Status: DC | PRN
  Administered 2020-09-25: 500 mL

## 2020-09-25 MED ORDER — SODIUM CHLORIDE (PF) 0.9 % IJ SOLN
INTRAMUSCULAR | Status: DC | PRN
  Administered 2020-09-25: 3 mL

## 2020-09-25 MED ORDER — PROPOFOL 10 MG/ML IV BOLUS
INTRAVENOUS | Status: DC | PRN
  Administered 2020-09-25: 150 mg via INTRAVENOUS

## 2020-09-25 MED ORDER — ACETAMINOPHEN 500 MG PO TABS
1000.0000 mg | ORAL_TABLET | Freq: Once | ORAL | Status: AC
  Administered 2020-09-25: 1000 mg via ORAL

## 2020-09-25 MED ORDER — LIDOCAINE 2% (20 MG/ML) 5 ML SYRINGE
INTRAMUSCULAR | Status: DC | PRN
  Administered 2020-09-25: 80 mg via INTRAVENOUS

## 2020-09-25 MED ORDER — AMISULPRIDE (ANTIEMETIC) 5 MG/2ML IV SOLN: 10.0000 mg | Freq: Once | INTRAVENOUS | Status: DC | PRN

## 2020-09-25 MED ORDER — LIDOCAINE 2% (20 MG/ML) 5 ML SYRINGE
INTRAMUSCULAR | Status: AC
  Filled 2020-09-25: qty 5

## 2020-09-25 MED ORDER — SULFAMETHOXAZOLE-TRIMETHOPRIM 800-160 MG PO TABS: 1.0000 | ORAL_TABLET | Freq: Two times a day (BID) | ORAL | 0 refills | Status: AC

## 2020-09-25 MED ORDER — LACTATED RINGERS IV SOLN: INTRAVENOUS | Status: DC

## 2020-09-25 MED ORDER — EPHEDRINE 5 MG/ML INJ
INTRAVENOUS | Status: AC
  Filled 2020-09-25: qty 10

## 2020-09-25 MED ORDER — ACETAMINOPHEN 500 MG PO TABS
ORAL_TABLET | ORAL | Status: AC
  Filled 2020-09-25: qty 2

## 2020-09-25 MED ORDER — TRAMADOL HCL 50 MG PO TABS: 50.0000 mg | ORAL_TABLET | Freq: Four times a day (QID) | ORAL | 0 refills | Status: AC | PRN

## 2020-09-25 MED ORDER — DEXAMETHASONE SODIUM PHOSPHATE 10 MG/ML IJ SOLN
INTRAMUSCULAR | Status: DC | PRN
  Administered 2020-09-25: 5 mg via INTRAVENOUS

## 2020-09-25 MED ORDER — PHENYLEPHRINE 40 MCG/ML (10ML) SYRINGE FOR IV PUSH (FOR BLOOD PRESSURE SUPPORT)
PREFILLED_SYRINGE | INTRAVENOUS | Status: DC | PRN
  Administered 2020-09-25: 80 ug via INTRAVENOUS

## 2020-09-25 MED ORDER — OXYCODONE HCL 5 MG PO TABS
5.0000 mg | ORAL_TABLET | Freq: Once | ORAL | Status: DC | PRN
Start: 2020-09-25 — End: 2020-09-25

## 2020-09-25 MED ORDER — FENTANYL CITRATE (PF) 100 MCG/2ML IJ SOLN: 25.0000 ug | INTRAMUSCULAR | Status: DC | PRN

## 2020-09-25 MED ORDER — PHENYLEPHRINE 40 MCG/ML (10ML) SYRINGE FOR IV PUSH (FOR BLOOD PRESSURE SUPPORT)
PREFILLED_SYRINGE | INTRAVENOUS | Status: AC
  Filled 2020-09-25: qty 10

## 2020-09-25 MED ORDER — CEFAZOLIN SODIUM-DEXTROSE 2-4 GM/100ML-% IV SOLN
INTRAVENOUS | Status: AC
  Filled 2020-09-25: qty 100

## 2020-09-25 MED ORDER — PROPOFOL 10 MG/ML IV BOLUS
INTRAVENOUS | Status: AC
  Filled 2020-09-25: qty 20

## 2020-09-25 MED ORDER — FENTANYL CITRATE (PF) 100 MCG/2ML IJ SOLN
INTRAMUSCULAR | Status: DC | PRN
  Administered 2020-09-25: 25 ug via INTRAVENOUS
  Administered 2020-09-25: 50 ug via INTRAVENOUS
  Administered 2020-09-25 (×4): 25 ug via INTRAVENOUS

## 2020-09-25 MED ORDER — ONDANSETRON HCL 4 MG/2ML IJ SOLN: 4.0000 mg | Freq: Once | INTRAMUSCULAR | Status: DC | PRN

## 2020-09-25 MED ORDER — EPHEDRINE SULFATE-NACL 50-0.9 MG/10ML-% IV SOSY
PREFILLED_SYRINGE | INTRAVENOUS | Status: DC | PRN
  Administered 2020-09-25: 15 mg via INTRAVENOUS

## 2020-09-25 SURGICAL SUPPLY — 36 items
BAG DRN RND TRDRP ANRFLXCHMBR (UROLOGICAL SUPPLIES) ×1
BAG URINE DRAIN 2000ML AR STRL (UROLOGICAL SUPPLIES) ×2 IMPLANT
BLADE CLIPPER SENSICLIP SURGIC (BLADE) ×2 IMPLANT
CATH FOLEY 2WAY SLVR  5CC 16FR (CATHETERS) ×1
CATH FOLEY 2WAY SLVR 5CC 16FR (CATHETERS) ×1 IMPLANT
CATH ROBINSON RED A/P 16FR (CATHETERS) IMPLANT
CATH ROBINSON RED A/P 20FR (CATHETERS) ×2 IMPLANT
CLOTH BEACON ORANGE TIMEOUT ST (SAFETY) ×2 IMPLANT
CNTNR URN SCR LID CUP LEK RST (MISCELLANEOUS) ×2 IMPLANT
CONT SPEC 4OZ STRL OR WHT (MISCELLANEOUS) ×4
COVER BACK TABLE 60X90IN (DRAPES) ×2 IMPLANT
COVER MAYO STAND STRL (DRAPES) ×2 IMPLANT
DRAPE C-ARM 35X43 STRL (DRAPES) ×2 IMPLANT
DRSG TEGADERM 4X4.75 (GAUZE/BANDAGES/DRESSINGS) ×2 IMPLANT
DRSG TEGADERM 8X12 (GAUZE/BANDAGES/DRESSINGS) ×4 IMPLANT
GAUZE SPONGE 4X4 12PLY STRL (GAUZE/BANDAGES/DRESSINGS) ×2 IMPLANT
GLOVE SURG ENC MOIS LTX SZ6.5 (GLOVE) ×2 IMPLANT
GLOVE SURG ENC MOIS LTX SZ7.5 (GLOVE) IMPLANT
GLOVE SURG ENC MOIS LTX SZ8 (GLOVE) ×4 IMPLANT
GLOVE SURG ORTHO LTX SZ8.5 (GLOVE) ×2 IMPLANT
GLOVE SURG POLYISO LF SZ6.5 (GLOVE) IMPLANT
GOWN STRL REUS W/TWL XL LVL3 (GOWN DISPOSABLE) ×2 IMPLANT
HOLDER FOLEY CATH W/STRAP (MISCELLANEOUS) ×2 IMPLANT
IMPL SPACEOAR VUE SYSTEM (Spacer) ×1 IMPLANT
IMPLANT SPACEOAR VUE SYSTEM (Spacer) ×2 IMPLANT
IV NS 1000ML (IV SOLUTION) ×2
IV NS 1000ML BAXH (IV SOLUTION) ×1 IMPLANT
KIT TURNOVER CYSTO (KITS) ×2 IMPLANT
MARKER SKIN DUAL TIP RULER LAB (MISCELLANEOUS) ×2 IMPLANT
PACK CYSTO (CUSTOM PROCEDURE TRAY) ×2 IMPLANT
SUT BONE WAX W31G (SUTURE) IMPLANT
SYR 10ML LL (SYRINGE) ×2 IMPLANT
TOWEL OR 17X26 10 PK STRL BLUE (TOWEL DISPOSABLE) ×2 IMPLANT
UNDERPAD 30X36 HEAVY ABSORB (UNDERPADS AND DIAPERS) ×4 IMPLANT
WATER STERILE IRR 500ML POUR (IV SOLUTION) ×2 IMPLANT
i seed agx100 ×132 IMPLANT

## 2020-09-25 NOTE — Anesthesia Procedure Notes (Signed)
Procedure Name: LMA Insertion Date/Time: 09/25/2020 11:16 AM Performed by: Suan Halter, CRNA Pre-anesthesia Checklist: Patient identified, Emergency Drugs available, Suction available and Patient being monitored Patient Re-evaluated:Patient Re-evaluated prior to induction Oxygen Delivery Method: Circle system utilized Preoxygenation: Pre-oxygenation with 100% oxygen Induction Type: IV induction Ventilation: Mask ventilation without difficulty LMA: LMA inserted LMA Size: 4.0 Number of attempts: 1 Airway Equipment and Method: Bite block Placement Confirmation: positive ETCO2 Tube secured with: Tape Dental Injury: Teeth and Oropharynx as per pre-operative assessment

## 2020-09-25 NOTE — Op Note (Signed)
Preoperative diagnosis: Clinical stage TI C adenocarcinoma the prostate   Postoperative diagnosis: Same   Procedure: I-125 prostate seed implantation, SpaceOAR placement, flexible cystoscopy  Surgeon: Lillette Boxer. Dahlstedt M.D.  Radiation Oncologist: Tyler Pita, M.D.  Anesthesia: Gen.   Indications: Patient  was diagnosed with clinical stage TI C prostate cancer. We had extensive discussion with him about treatment options versus. He elected to proceed with seed implantation. He underwent consultation my office as well as with Dr. Tammi Klippel. He appeared to understand the advantages disadvantages potential risks of this treatment option. Full informed consent has been obtained.   Technique and findings: Patient was brought the operating room where he had successful induction of general anesthesia. He was placed in dorso-lithotomy position and prepped and draped in usual manner. Appropriate surgical timeout was performed. Radiation oncology department placed a transrectal ultrasound probe anchoring stand. Foley catheter with contrast in the balloon was inserted without difficulty. Anchoring needles were placed within the prostate. Rectal tube was placed. Real-time contouring of the urethra prostate and rectum were performed and the dosing parameters were established. Targeted dose was 145 gray.  I was then called  to the operating suite suite for placement of the needles. A second timeout was performed. All needle passage was done with real-time transrectal ultrasound guidance with the sagittal plane. A total of 18 needles were placed.  66 active seeds were implanted.  I then proceeded with placement of SpaceOAR by introducing a needle with the bevel angled inferiorly approximately 2 cm superior to the anus. This was angled downward and under direct ultrasound was placed within the space between the prostatic capsule and rectum. This was confirmed with a small amount of sterile saline injected and  this was performed under direct ultrasound. I then attached the SpaceOAR to the needle and injected this in the space between the prostate and rectum with good placement noted. The Foley catheter was removed and flexible cystoscopy failed to show any seeds outside the prostate.  The patient was brought to recovery room in stable condition, having tolerated the procedure well.Marland Kitchen

## 2020-09-25 NOTE — Progress Notes (Signed)
  Radiation Oncology         [phone] ________________________________  Name: Rodney Martin MRN: [id number]  Date: 09/25/2020  DOB: 06-23-1946       Prostate Seed Implant  Rodney Martin, Rodney Fossa, MD  No ref. provider found  DIAGNOSIS: 75 y.o. gentleman with Stage T1c adenocarcinoma of the prostate with Gleason score of 3+4, and PSA of 5.87.  Oncology History  Malignant neoplasm of prostate ([place])  05/22/2020 Cancer Staging   Staging form: Prostate, AJCC 8th Edition - Clinical stage from 05/22/2020: Stage IIB (cT1c, cN0, cM0, PSA: 5.9, Grade Group: 2) - Signed by Freeman Caldron, PA-C on 07/04/2020   07/04/2020 Initial Diagnosis   Malignant neoplasm of prostate ([place])     No diagnosis found.  PROCEDURE: Insertion of radioactive I-125 seeds into the prostate gland.  RADIATION DOSE: 145 Gy, definitive therapy.  TECHNIQUE: [name] was brought to the operating room with the urologist. He was placed in the dorsolithotomy position. He was catheterized and a rectal tube was inserted. The perineum was shaved, prepped and draped. The ultrasound probe was then introduced into the rectum to see the prostate gland.  TREATMENT DEVICE: A needle grid was attached to the ultrasound probe stand and anchor needles were placed.  3D PLANNING: The prostate was imaged in 3D using a sagittal sweep of the prostate probe. These images were transferred to the planning computer. There, the prostate, urethra and rectum were defined on each axial reconstructed image. Then, the software created an optimized 3D plan and a few seed positions were adjusted. The quality of the plan was reviewed using [facility] information for the target and the following two organs at risk:  Urethra and Rectum.  Then the accepted plan was printed and handed off to the radiation therapist.  Under my supervision, the custom loading of the seeds and spacers was carried out and loaded into sealed vicryl sleeves.  These pre-loaded  needles were then placed into the needle holder.Marland Kitchen  PROSTATE VOLUME STUDY:  Using transrectal ultrasound the volume of the prostate was verified to be 33 cc.  SPECIAL TREATMENT PROCEDURE/SUPERVISION AND HANDLING: The pre-loaded needles were then delivered under sagittal guidance. A total of 18 needles were used to deposit 66 seeds in the prostate gland. The individual seed activity was 0.430 mCi.  SpaceOAR:  Yes  COMPLEX SIMULATION: At the end of the procedure, an anterior radiograph of the pelvis was obtained to document seed positioning and count. Cystoscopy was performed to check the urethra and bladder.  MICRODOSIMETRY: At the end of the procedure, the patient was emitting 0.107 mR/hr at 1 meter. Accordingly, he was considered safe for hospital discharge.  PLAN: The patient will return to the radiation oncology clinic for post implant CT dosimetry in three weeks.   ________________________________  [name], M.D.

## 2020-09-25 NOTE — Anesthesia Preprocedure Evaluation (Addendum)
Anesthesia Evaluation  Patient identified by MRN, date of birth, ID band Patient awake    Reviewed: Allergy & Precautions, NPO status , Patient's Chart, lab work & pertinent test results  Airway Mallampati: II  TM Distance: >3 FB Neck ROM: Full    Dental no notable dental hx.    Pulmonary COPD, former smoker,    Pulmonary exam normal breath sounds clear to auscultation       Cardiovascular Exercise Tolerance: Good negative cardio ROS Normal cardiovascular exam Rhythm:Regular Rate:Normal  10-Aug-2020 11:18:18 [facility]-PRE ROUTINE RECORD Normal sinus rhythm Normal ECG Confirmed by Larae Grooms [phone]) on 08/10/2020 2:32:16 PM   Neuro/Psych negative neurological ROS  negative psych ROS   GI/Hepatic negative GI ROS, Neg liver ROS,   Endo/Other  negative endocrine ROS  Renal/GU negative Renal ROS   Prostate cancer    Musculoskeletal  (+) Arthritis  (psoriatic),   Abdominal   Peds negative pediatric ROS (+)  Hematology negative hematology ROS (+)   Anesthesia Other Findings   Reproductive/Obstetrics negative OB ROS                            Anesthesia Physical Anesthesia Plan  ASA: II  Anesthesia Plan: General   Post-op Pain Management:    Induction:   PONV Risk Score and Plan: 2 and Treatment may vary due to age or medical condition, Ondansetron, Dexamethasone and Midazolam  Airway Management Planned: LMA  Additional Equipment: None  Intra-op Plan:   Post-operative Plan: Extubation in OR  Informed Consent: I have reviewed the patients History and Physical, chart, labs and discussed the procedure including the risks, benefits and alternatives for the proposed anesthesia with the patient or authorized representative who has indicated his/her understanding and acceptance.     Dental advisory given  Plan Discussed with: CRNA, Anesthesiologist and  Surgeon  Anesthesia Plan Comments:         Anesthesia Quick Evaluation

## 2020-09-25 NOTE — Discharge Instructions (Signed)
Radioactive Seed Implant Home Care Instructions   Activity:   Rest for the remainder of the day.  Do not drive or operate equipment today.  You may resume normal activities in a few days as instructed by your physician, without risk of harmful radiation exposure to those around you, provided you follow the time and distance precautions on the Radiation Oncology Instruction Sheet.   Meals: Drink plenty of lipuids and eat light foods, such as gelatin or soup this evening .  You may return to normal meal plan tomorrow.  Return To Work: You may return to work as instructed by Naval architect.  Special Instruction:   If any seeds are found, use tweezers to pick up seeds and place in a glass container of any kind and bring to your physician's office.  Call your physician if any of these symptoms occur:   Persistent or heavy bleeding  Urine stream diminishes or stops completely after catheter is removed  Fever equal to or greater than 101 degrees F  Cloudy urine with a strong foul odor  Severe pain  You may feel some burning pain and/or hesitancy when you urinate after the catheter is removed.  These symptoms may increase over the next few weeks, but should diminish within forur to six weeks.  Applying moist heat to the lower abdomen or a hot tub bath may help relieve the pain.  If the discomfort becomes severe, please call your physician for additional medications.   Post Anesthesia Home Care Instructions  Activity: Get plenty of rest for the remainder of the day. A responsible individual must stay with you for 24 hours following the procedure.  For the next 24 hours, DO NOT: -Drive a car -Paediatric nurse -Drink alcoholic beverages -Take any medication unless instructed by your physician -Make any legal decisions or sign important papers.  Meals: Start with liquid foods such as gelatin or soup. Progress to regular foods as tolerated. Avoid greasy, spicy, heavy foods. If nausea  and/or vomiting occur, drink only clear liquids until the nausea and/or vomiting subsides. Call your physician if vomiting continues.  Special Instructions/Symptoms: Your throat may feel dry or sore from the anesthesia or the breathing tube placed in your throat during surgery. If this causes discomfort, gargle with warm salt water. The discomfort should disappear within 24 hours.  May take Tylenol after 4 PM if needed for pain.

## 2020-09-25 NOTE — Transfer of Care (Signed)
Immediate Anesthesia Transfer of Care Note  Patient: Rodney Martin  Procedure(s) Performed: Procedure(s) (LRB): RADIOACTIVE SEED IMPLANT/BRACHYTHERAPY IMPLANT (N/A) SPACE OAR INSTILLATION (N/A)  Patient Location: PACU  Anesthesia Type: General  Level of Consciousness: awake, oriented, sedated and patient cooperative  Airway & Oxygen Therapy: Patient Spontanous Breathing and Patient connected to face mask oxygen  Post-op Assessment: Report given to PACU RN and Post -op Vital signs reviewed and stable  Post vital signs: Reviewed and stable  Complications: No apparent anesthesia complications Last Vitals:  Vitals Value Taken Time  BP 116/70 09/25/20 1245  Temp 36.8 C 09/25/20 1245  Pulse 80 09/25/20 1256  Resp 12 09/25/20 1256  SpO2 96 % 09/25/20 1256  Vitals shown include unvalidated device data.  Last Pain:  Vitals:   09/25/20 0936  TempSrc: Oral  PainSc: 0-No pain      Patients Stated Pain Goal: 5 (49/17/91 [id number])  Complications: No complications documented.

## 2020-09-25 NOTE — H&P (Signed)
H&P  Chief Complaint: PCa  History of Present Illness: 75 yo male presents for I 125 brachytherapy for mgnt of PCa.  Past Medical History:  Diagnosis Date  . Arthritis   . COPD (chronic obstructive pulmonary disease) ([place])   . Prostate cancer ([place])   . Psoriatic arthritis ([place])   . Skin cancer    basal cell areas removed, squamoud cell  . Wears glasses   . Wears hearing aid in both ears     Past Surgical History:  Procedure Laterality Date  . CATARACT EXTRACTION Bilateral   . COLONOSCOPY  last done 2016  . POLYPECTOMY  2016  . PROSTATE BIOPSY    . SKIN CANCER EXCISION     multiple sites  . TONSILLECTOMY  age 11    Home Medications:    Allergies:  Allergies  Allergen Reactions  . Remicade [Infliximab] Anaphylaxis  . Aspirin Itching  . Humira [Adalimumab] Other (See Comments)    psoriasis  . Bacitracin-Polymyxin B Rash  . Naproxen Rash    rash    Family History  Problem Relation Age of Onset  . Colon cancer Brother   . Breast cancer Sister   . Pancreatic cancer Brother   . Colon polyps Neg Hx   . Rectal cancer Neg Hx   . Stomach cancer Neg Hx   . Esophageal cancer Neg Hx     Social History:  reports that he quit smoking about 2 years ago. His smoking use included cigarettes. He has a 60.00 pack-year smoking history. He has never used smokeless tobacco. He reports that he does not drink alcohol and does not use drugs.  ROS: A complete review of systems was performed.  All systems are negative except for pertinent findings as noted.  Physical Exam:  Vital signs in last 24 hours: BP (!) 146/85   Pulse 76   Temp (!) 97 F (36.1 C) (Oral)   Resp 15   Ht 5\' 10"  (1.778 m)   Wt 85.9 kg   SpO2 97%   BMI 27.16 kg/m  Constitutional:  Alert and oriented, No acute distress Cardiovascular: Regular rate  Respiratory: Normal respiratory effort GI: Abdomen is soft, nontender, nondistended, no abdominal masses. No CVAT.  Genitourinary: Normal male phallus,  testes are descended bilaterally and non-tender and without masses, scrotum is normal in appearance without lesions or masses, perineum is normal on inspection. Lymphatic: No lymphadenopathy Neurologic: Grossly intact, no focal deficits Psychiatric: Normal mood and affect  Laboratory Data:  Recent Labs    09/22/20 1110  WBC 8.0  HGB 14.4  HCT 44.7  PLT 243    Recent Labs    09/22/20 1110  NA 138  K 4.6  CL 107  GLUCOSE 101*  BUN 18  CALCIUM 9.5  CREATININE 1.08     No results found for this or any previous visit (from the past 24 hour(s)). Recent Results (from the past 240 hour(s))  SARS CORONAVIRUS 2 (TAT 6-24 HRS) Nasopharyngeal Nasopharyngeal Swab     Status: None   Collection Time: 09/22/20  1:59 PM   Specimen: Nasopharyngeal Swab  Result Value Ref Range Status   SARS Coronavirus 2 NEGATIVE NEGATIVE Final    Comment: (NOTE) SARS-CoV-2 target nucleic acids are NOT DETECTED.  The SARS-CoV-2 RNA is generally detectable in upper and lower respiratory specimens during the acute phase of infection. Negative results do not preclude SARS-CoV-2 infection, do not rule out co-infections with other pathogens, and should not be used as the sole  basis for treatment or other patient management decisions. Negative results must be combined with clinical observations, patient history, and epidemiological information. The expected result is Negative.  Fact Sheet for Patients: SugarRoll.be  Fact Sheet for Healthcare Providers: https://www.woods-mathews.com/  This test is not yet approved or cleared by the [place] FDA and  has been authorized for detection and/or diagnosis of SARS-CoV-2 by FDA under an Emergency Use Authorization (EUA). This EUA will remain  in effect (meaning this test can be used) for the duration of the COVID-19 declaration under Se ction 564(b)(1) of the Act, 21 U.S.C. section 360bbb-3(b)(1), unless the  authorization is terminated or revoked sooner.  Performed at [facility], [place] [address] [place], [place] [address]     Renal Function: Recent Labs    09/22/20 1110  CREATININE 1.08   Estimated Creatinine Clearance: 61 mL/min (by C-G formula based on SCr of 1.08 mg/dL).  Radiologic Imaging: No results found.  Impression/Assessment:  Pca  Plan:  I 125 brachytherapy

## 2020-09-25 NOTE — Anesthesia Postprocedure Evaluation (Signed)
Anesthesia Post Note  Patient: Rodney Martin  Procedure(s) Performed: RADIOACTIVE SEED IMPLANT/BRACHYTHERAPY IMPLANT (N/A Prostate) SPACE OAR INSTILLATION (N/A Prostate)     Patient location during evaluation: PACU Anesthesia Type: General Level of consciousness: awake and alert Pain management: pain level controlled Vital Signs Assessment: post-procedure vital signs reviewed and stable Respiratory status: spontaneous breathing, nonlabored ventilation and respiratory function stable Cardiovascular status: blood pressure returned to baseline and stable Postop Assessment: no apparent nausea or vomiting Anesthetic complications: no   No complications documented.  Last Vitals:  Vitals:   09/25/20 1300 09/25/20 1315  BP: 122/75 120/80  Pulse: 83 85  Resp: 13 16  Temp:    SpO2: 93% 98%    Last Pain:  Vitals:   09/25/20 1300  TempSrc:   PainSc: 0-No pain                 Lidia Collum

## 2020-09-26 ENCOUNTER — Encounter (HOSPITAL_BASED_OUTPATIENT_CLINIC_OR_DEPARTMENT_OTHER): Payer: Self-pay | Admitting: Urology

## 2020-11-01 ENCOUNTER — Telehealth: Payer: Self-pay | Admitting: *Deleted

## 2020-11-01 NOTE — Telephone Encounter (Signed)
CALLED PATIENT TO REMIND OF POST SEED APPTS. FOR 11-02-20, LVM FOR A RETURN CALL 

## 2020-11-02 ENCOUNTER — Ambulatory Visit
Admission: RE | Admit: 2020-11-02 | Discharge: 2020-11-02 | Disposition: A | Discharge status: Home / Self Care | Payer: Medicare Other | Source: Ambulatory Visit | Attending: Radiation Oncology | Admitting: Radiation Oncology

## 2020-11-02 ENCOUNTER — Other Ambulatory Visit: Payer: Self-pay

## 2020-11-02 ENCOUNTER — Ambulatory Visit
Admission: RE | Admit: 2020-11-02 | Discharge: 2020-11-02 | Disposition: A | Discharge status: Home / Self Care | Payer: Medicare Other | Source: Ambulatory Visit | Attending: Urology | Admitting: Urology

## 2020-11-02 ENCOUNTER — Encounter: Payer: Self-pay | Admitting: Urology

## 2020-11-02 VITALS — BP 119/70 | HR 75 | Temp 97.0°F | Resp 18 | Ht 70.0 in | Wt 189.2 lb

## 2020-11-02 DIAGNOSIS — Z79899 Other long term (current) drug therapy: Secondary | ICD-10-CM | POA: Insufficient documentation

## 2020-11-02 DIAGNOSIS — C61 Malignant neoplasm of prostate: Secondary | ICD-10-CM | POA: Diagnosis present

## 2020-11-02 DIAGNOSIS — Z923 Personal history of irradiation: Secondary | ICD-10-CM | POA: Diagnosis not present

## 2020-11-02 DIAGNOSIS — R5383 Other fatigue: Secondary | ICD-10-CM | POA: Insufficient documentation

## 2020-11-02 NOTE — Progress Notes (Signed)
  Radiation Oncology         [phone] ________________________________  Name: Rodney Martin MRN: [id number]  Date: 11/02/2020  DOB: [birth year]  COMPLEX SIMULATION NOTE  NARRATIVE:  The patient was brought to the [facility] today following prostate seed implantation approximately one month ago.  Identity was confirmed.  All relevant records and images related to the planned course of therapy were reviewed.  Then, the patient was set-up supine.  CT images were obtained.  The CT images were loaded into the planning software.  Then the prostate and rectum were contoured.  Treatment planning then occurred.  The implanted iodine 125 seeds were identified by the physics staff for projection of radiation distribution  I have requested : 3D Simulation  I have requested a DVH of the following structures: Prostate and rectum.    ________________________________  [name], M.D.

## 2020-11-02 NOTE — Progress Notes (Signed)
Radiation Oncology         [phone] ________________________________  Name: Rodney Martin MRN: [id number]  Date: 11/02/2020  DOB: 1946/05/14  Post-Seed Follow-Up Visit Note  CC: Rodney Sos, MD  Franchot Gallo, MD  Diagnosis:   75 y.o. gentleman with Stage T1c adenocarcinoma of the prostate with Gleason score of 3+4, and PSA of 5.87.    ICD-10-CM   1. Malignant neoplasm of prostate ([place])  C61     Interval Since Last Radiation:  5.5 weeks 09/25/20:  Insertion of radioactive I-125 seeds into the prostate gland; 145 Gy, definitive therapy with placement of SpaceOAR gel.  Narrative:  The patient returns today for routine follow-up.  He is complaining of increased urinary frequency and urinary hesitation symptoms. He filled out a questionnaire regarding urinary function today providing and overall IPSS score of 16 characterizing his symptoms as moderate with increased urgency, weak stream and nocturia x3.  His pre-implant score was 5. He denies abdominal pain but has some bowel urgency and soft, loose stools 2-3x/day.  He met with Jiles Crocker, NP on 10/16/2020 and was started on Flomax at that time which has helped improve his LUTS.  He continues taking his daily as prescribed.  He reports only very mild dysuria at the start of his stream at this point.  He specifically denies gross hematuria, straining to void, incomplete emptying or incontinence.  He reports a healthy appetite and is maintaining his weight.  He has noticed some modest fatigue/decreased stamina but has been able to remain active.  Overall, he is pleased with his progress to date.  ALLERGIES:  is allergic to remicade [infliximab], aspirin, humira [adalimumab], bacitracin-polymyxin b, and naproxen.  Meds: Current Outpatient Medications  Medication Sig Dispense Refill  . betamethasone dipropionate 0.05 % cream Apply topically 2 (two) times daily as needed.    . calcium carbonate (OS-CAL) 600 MG TABS tablet Take  600 mg by mouth 2 (two) times daily with a meal.    . cholecalciferol (VITAMIN D) 1000 UNITS tablet Take 2,000 Units by mouth daily.    Marland Kitchen denosumab (PROLIA) 60 MG/ML SOSY injection Inject 60 mg into the skin every 6 (six) months.    . Glucosamine-Chondroitin 1500-1200 MG/30ML LIQD 1 pill per day    . Multiple Vitamin (MULTIVITAMIN) tablet Take 1 tablet by mouth daily.    . Omega-3 Fatty Acids (FISH OIL) 1000 MG CAPS Take by mouth.    . pimecrolimus (ELIDEL) 1 % cream Apply topically 2 (two) times daily as needed.    . sulfamethoxazole-trimethoprim (BACTRIM DS) 800-160 MG tablet Take 1 tablet by mouth 2 (two) times daily. 6 tablet 0  . traMADol (ULTRAM) 50 MG tablet Take 1 tablet (50 mg total) by mouth every 6 (six) hours as needed. 10 tablet 0  . ustekinumab (STELARA) 45 MG/0.5ML SOSY injection Inject 45 mg into the skin. Every 12 weeks     No current facility-administered medications for this encounter.    Physical Findings: In general this is a well appearing Caucasian male in no acute distress. He's alert and oriented x4 and appropriate throughout the examination. Cardiopulmonary assessment is negative for acute distress and he exhibits normal effort.   Lab Findings: Lab Results  Component Value Date   WBC 8.0 09/22/2020   HGB 14.4 09/22/2020   HCT 44.7 09/22/2020   MCV 89.0 09/22/2020   PLT 243 09/22/2020    Radiographic Findings:  Patient underwent CT imaging in our clinic for post implant dosimetry.  The CT will be reviewed by Dr. Tammi Klippel to confirm there is an adequate distribution of radioactive seeds throughout the prostate gland and ensure that there are no seeds in or near the rectum. We suspect the final radiation plan and dosimetry will show appropriate coverage of the prostate gland. He understands that we will call and inform him of any unexpected findings on further review of his imaging and dosimetry.  Impression/Plan: 75 y.o. gentleman with Stage T1c adenocarcinoma of  the prostate with Gleason score of 3+4, and PSA of 5.87. The patient is recovering from the effects of radiation. His urinary symptoms should gradually improve over the next 4-6 months. We talked about this today.  He will continue taking Flomax as prescribed.  He is encouraged by his improvement already and is otherwise pleased with his outcome. We also talked about long-term follow-up for prostate cancer following seed implant. He understands that ongoing PSA determinations and digital rectal exams will help perform surveillance to rule out disease recurrence. He saw Jiles Crocker, NP 10/16/20 and has a follow up appointment scheduled with Dr. Diona Fanti on 01/01/21 with labs prior to that visit. He understands what to expect with his PSA measures. Patient was also educated today about some of the long-term effects from radiation including a small risk for rectal bleeding and possibly erectile dysfunction. We talked about some of the general management approaches to these potential complications. However, I did encourage the patient to contact our office or return at any point if he has questions or concerns related to his previous radiation and prostate cancer.    Nicholos Johns, PA-C

## 2020-11-02 NOTE — Progress Notes (Signed)
Patient is here today for a post -seed follow-up. Patient reports  Dysuria Some  Hematuria None Nocturia x 2-3 Leakage No Urgency Yes Emptying bladder No Stream Weak Push /strain to start stream  No Stop /go during urination Varies  Constipation/Diarrhea  Loose stools 2-3 times per day IPPS was 16  Next appointment with urologist June unsure of day Vitals:   11/02/20 0932  BP: 119/70  Pulse: 75  Resp: 18  Temp: (!) 97 F (36.1 C)  TempSrc: Temporal  SpO2: 97%  Weight: 85.8 kg  Height: 5\' 10"  (1.778 m)

## 2020-11-15 ENCOUNTER — Telehealth: Payer: Self-pay | Admitting: Radiation Oncology

## 2020-11-15 NOTE — Telephone Encounter (Signed)
Received voicemail message from patient requesting return call. Phoned patient back to inquire. Wife answered the phone. She inquired if her husband 8 weeks out from prostate seed implant can safely get his third COVID booster. Assured her that her husband can get his third booster safely. She verbalized understanding. She denied any further needs. She verbalized understanding of all reviewed.

## 2020-11-21 ENCOUNTER — Encounter: Payer: Self-pay | Admitting: Radiation Oncology

## 2020-11-21 ENCOUNTER — Ambulatory Visit
Admission: RE | Admit: 2020-11-21 | Discharge: 2020-11-21 | Disposition: A | Discharge status: Home / Self Care | Payer: Medicare Other | Source: Ambulatory Visit | Attending: Radiation Oncology | Admitting: Radiation Oncology

## 2020-11-21 DIAGNOSIS — C61 Malignant neoplasm of prostate: Secondary | ICD-10-CM | POA: Diagnosis present

## 2020-11-27 NOTE — Progress Notes (Signed)
  Radiation Oncology         [phone] ________________________________  Name: Rodney Martin MRN: [id number]  Date: 11/21/2020  DOB: February 12, 1946  3D Planning Note   Prostate Brachytherapy Post-Implant Dosimetry  Diagnosis:  75 y.o. gentleman with Stage T1c adenocarcinoma of the prostate with Gleason score of 3+4, and PSA of 5.87.  Narrative: On a previous date, Rodney Martin returned following prostate seed implantation for post implant planning. He underwent CT scan complex simulation to delineate the three-dimensional structures of the pelvis and demonstrate the radiation distribution.  Since that time, the seed localization, and complex isodose planning with dose volume histograms have now been completed.  Results:   Prostate Coverage - The dose of radiation delivered to the 90% or more of the prostate gland (D90) was 93.91% of the prescription dose. This exceeds our goal of greater than 90%. Rectal Sparing - The volume of rectal tissue receiving the prescription dose or higher was 0.0 cc. This falls under our thresholds tolerance of 1.0 cc.  Impression: The prostate seed implant appears to show adequate target coverage and appropriate rectal sparing.  Plan:  The patient will continue to follow with urology for ongoing PSA determinations. I would anticipate a high likelihood for local tumor control with minimal risk for rectal morbidity.  ________________________________  [name], M.D.

## 2022-03-26 IMAGING — CR DG CHEST 2V
2 series · 2 of 2 positions shown · non-contrast
Comparison: None.

CLINICAL DATA: Preop for urologic surgery.

EXAM:
CHEST - 2 VIEW

[w chest pa]
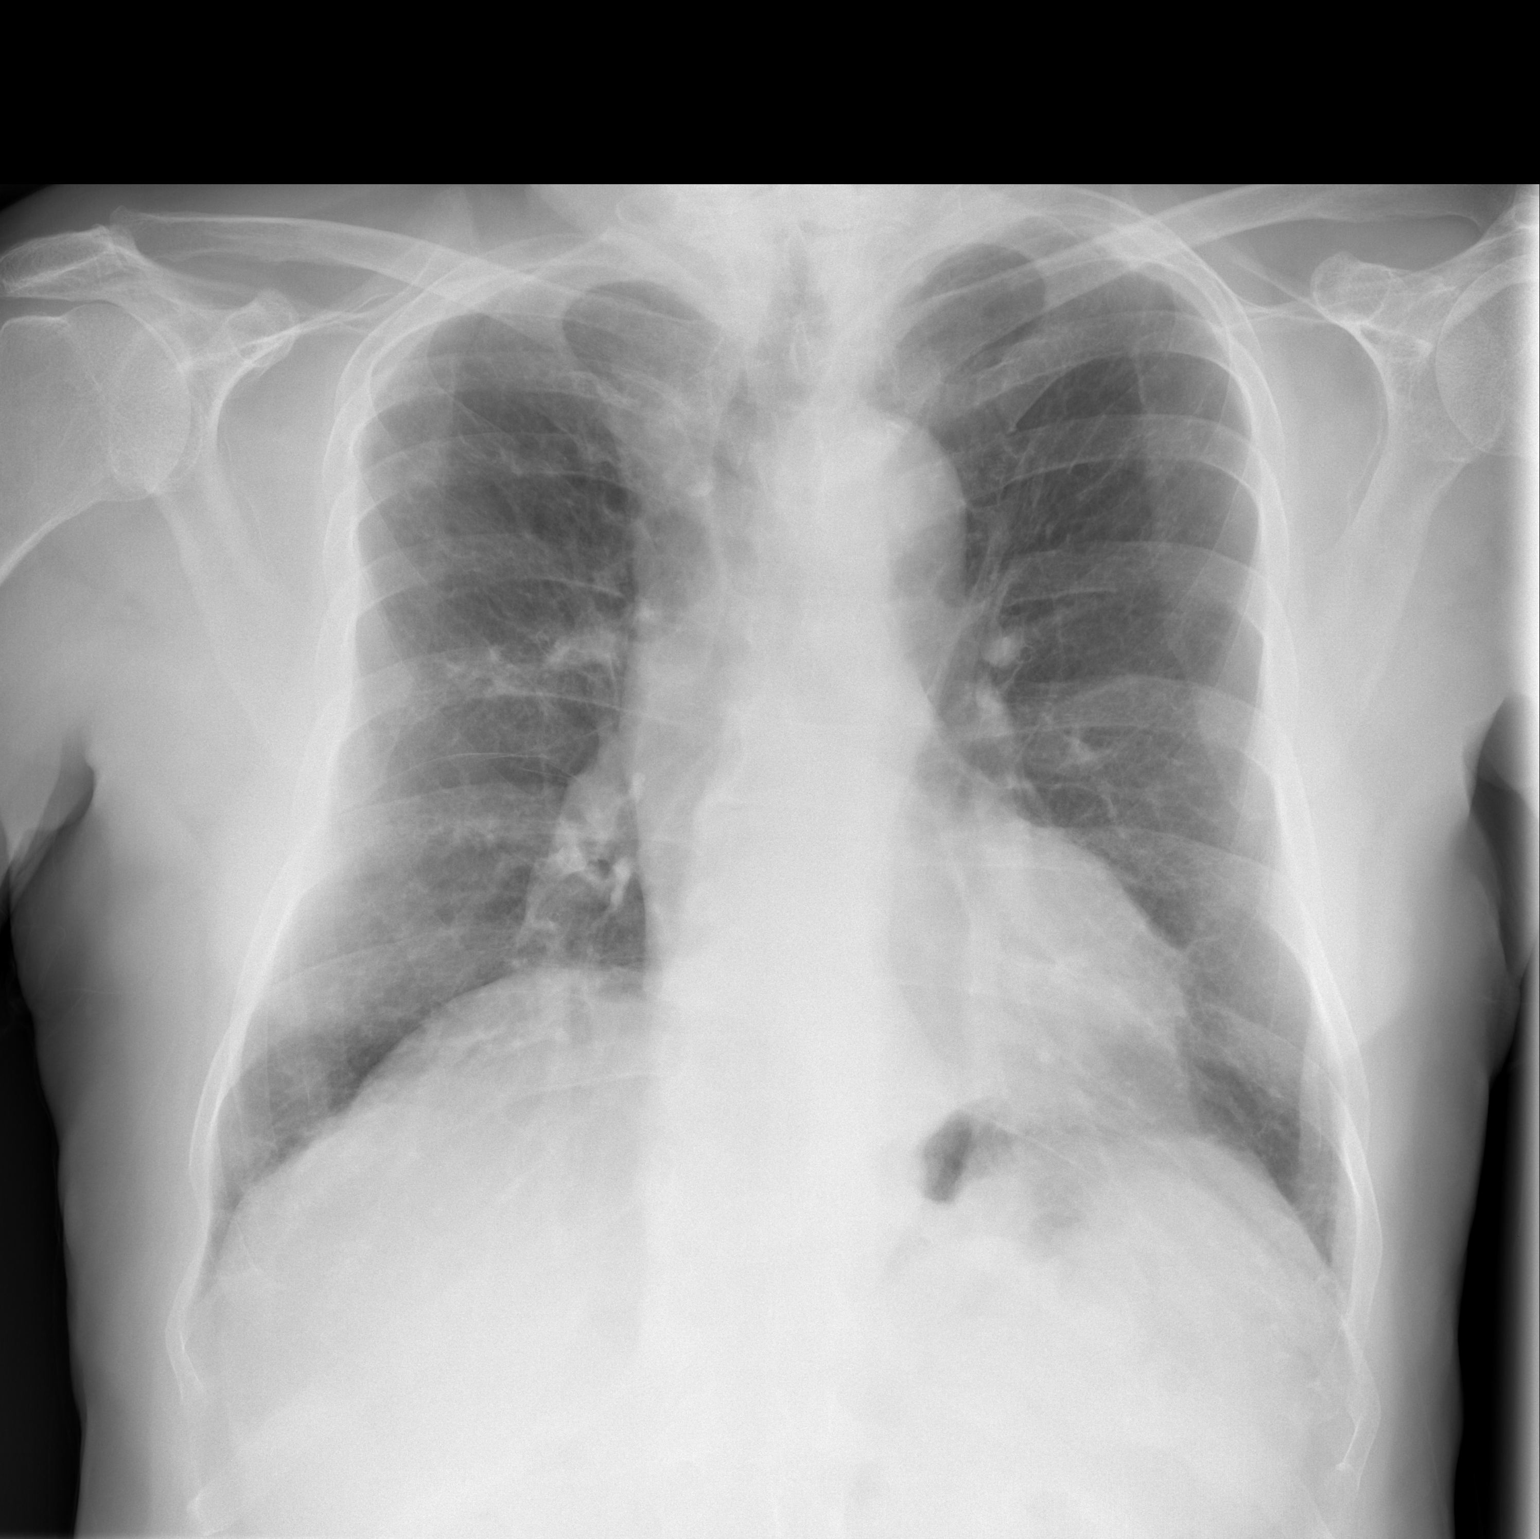

[w chest lat]
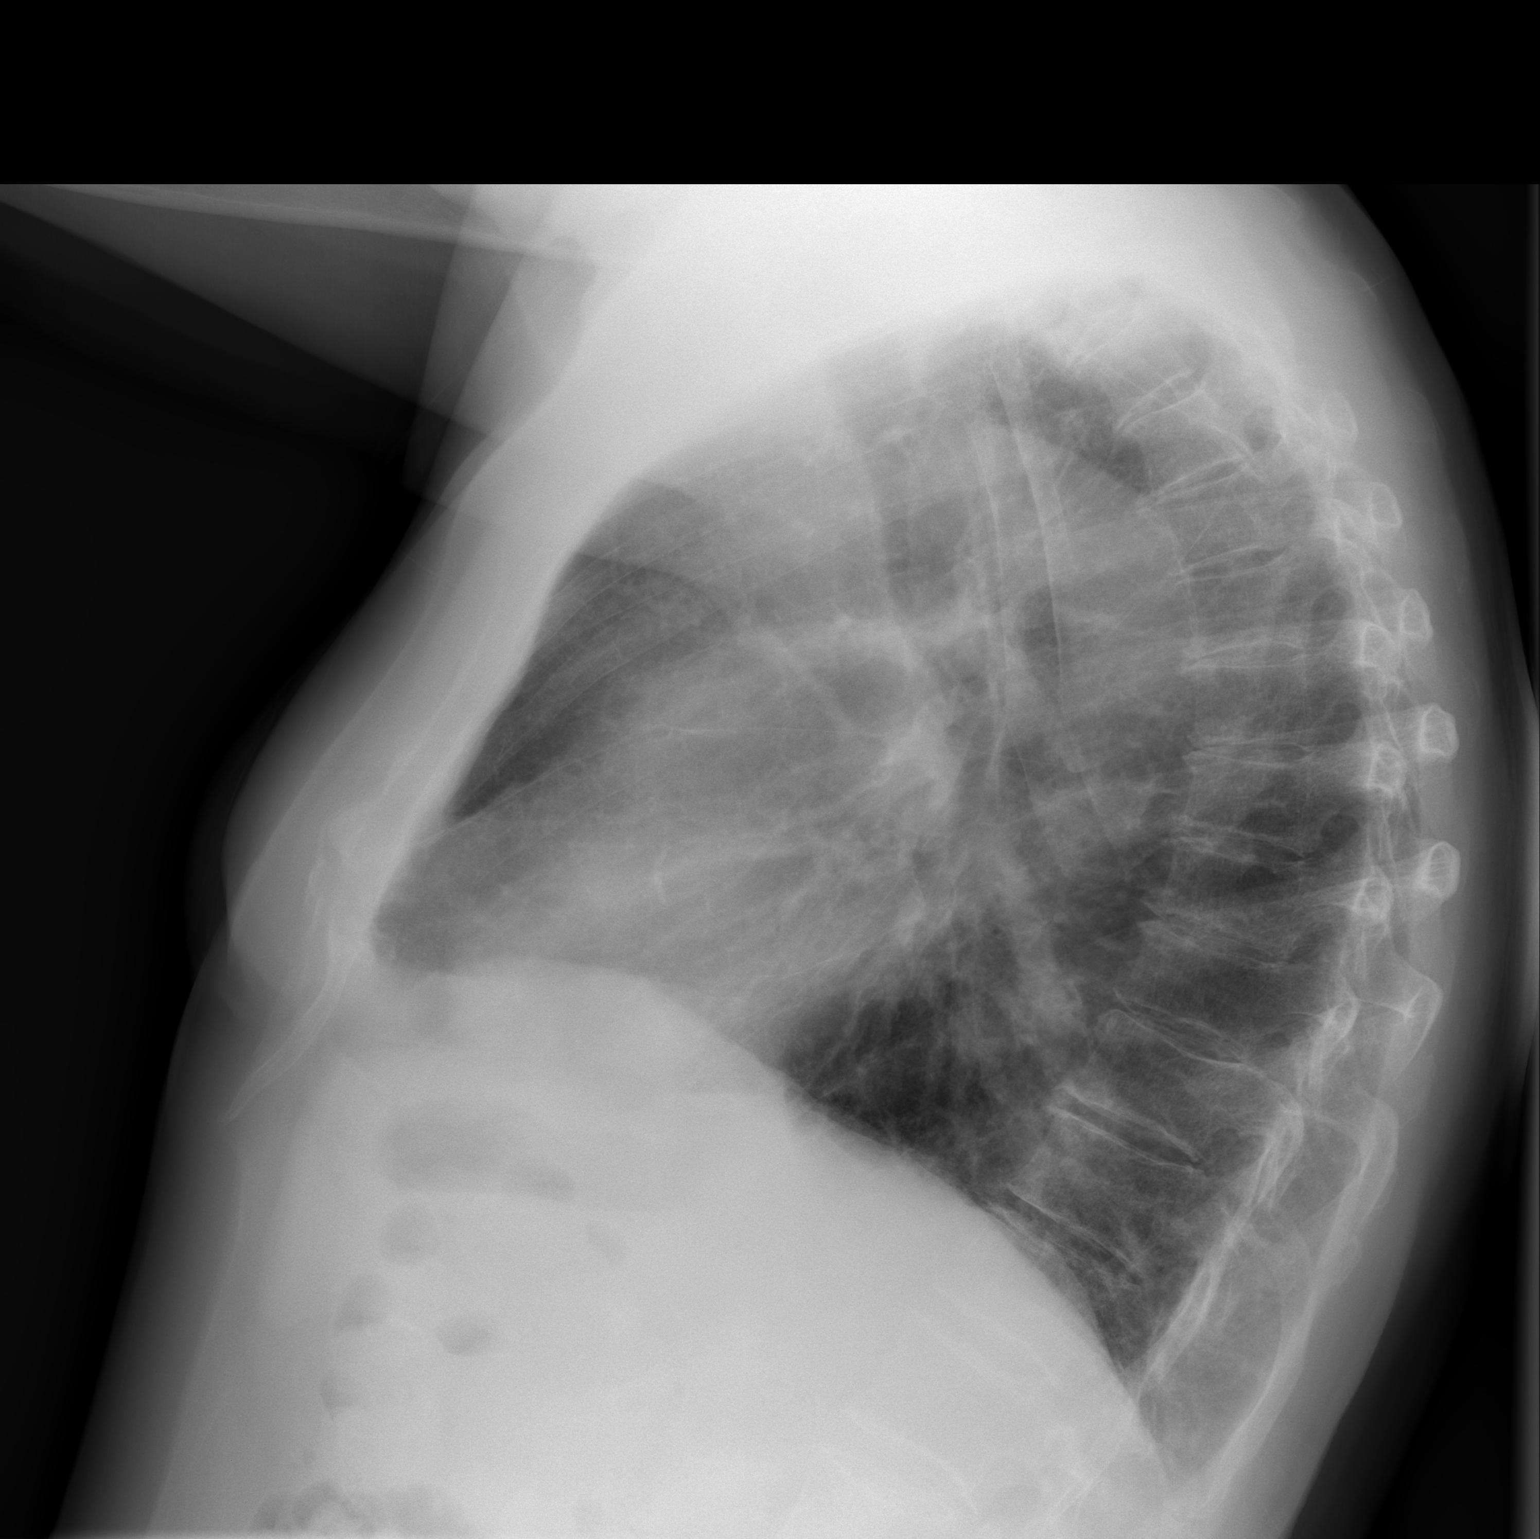

[2 of 2 positions shown; findings below may reference images not displayed]

FINDINGS: Hyperinflation. Patient rotated minimally right. Normal heart size.
Tortuous thoracic aorta. No well-defined lobar consolidation or
dominant lung nodule/mass. Pulmonary interstitial thickening is
asymmetric, greater right than left.
IMPRESSION: Hyperinflation with pulmonary interstitial thickening, most likely
related to chronic bronchitis/smoking. Given asymmetry, if there are
priors for comparison, these should be reviewed. If not , and the
patient has acute respiratory symptoms that could suggest infection,
consider further evaluation with CT.

## 2023-01-16 ENCOUNTER — Encounter: Payer: Self-pay | Admitting: Internal Medicine

## 2023-03-19 ENCOUNTER — Encounter: Payer: Self-pay | Admitting: Internal Medicine

## 2023-05-12 ENCOUNTER — Ambulatory Visit (AMBULATORY_SURGERY_CENTER): Payer: Medicare Other

## 2023-05-12 VITALS — Ht 70.0 in | Wt 190.0 lb

## 2023-05-12 DIAGNOSIS — Z860100 Personal history of colon polyps, unspecified: Secondary | ICD-10-CM

## 2023-05-12 MED ORDER — NA SULFATE-K SULFATE-MG SULF 17.5-3.13-1.6 GM/177ML PO SOLN: 1.0000 | Freq: Once | ORAL | 0 refills | Status: AC

## 2023-05-12 NOTE — Progress Notes (Signed)

## 2023-05-19 ENCOUNTER — Encounter: Payer: Self-pay | Admitting: Internal Medicine

## 2023-05-26 ENCOUNTER — Encounter: Payer: Self-pay | Admitting: Internal Medicine

## 2023-05-26 ENCOUNTER — Ambulatory Visit: Payer: Medicare Other | Admitting: Internal Medicine

## 2023-05-26 VITALS — BP 112/67 | HR 72 | Temp 98.9°F | Resp 16 | Ht 70.0 in | Wt 190.0 lb

## 2023-05-26 DIAGNOSIS — Z09 Encounter for follow-up examination after completed treatment for conditions other than malignant neoplasm: Secondary | ICD-10-CM

## 2023-05-26 DIAGNOSIS — Z860101 Personal history of adenomatous and serrated colon polyps: Secondary | ICD-10-CM | POA: Diagnosis not present

## 2023-05-26 DIAGNOSIS — D122 Benign neoplasm of ascending colon: Secondary | ICD-10-CM

## 2023-05-26 DIAGNOSIS — K621 Rectal polyp: Secondary | ICD-10-CM | POA: Diagnosis not present

## 2023-05-26 DIAGNOSIS — K635 Polyp of colon: Secondary | ICD-10-CM | POA: Diagnosis not present

## 2023-05-26 DIAGNOSIS — D128 Benign neoplasm of rectum: Secondary | ICD-10-CM

## 2023-05-26 DIAGNOSIS — D123 Benign neoplasm of transverse colon: Secondary | ICD-10-CM | POA: Diagnosis not present

## 2023-05-26 DIAGNOSIS — Z860100 Personal history of colon polyps, unspecified: Secondary | ICD-10-CM

## 2023-05-26 MED ORDER — SODIUM CHLORIDE 0.9 % IV SOLN: 500.0000 mL | Freq: Once | INTRAVENOUS | Status: DC

## 2023-05-26 NOTE — Progress Notes (Signed)
Vss nad trans to pacu 

## 2023-05-26 NOTE — Progress Notes (Signed)
Called to room to assist during endoscopic procedure.  Patient ID and intended procedure confirmed with present staff. Received instructions for my participation in the procedure from the performing physician.  

## 2023-05-26 NOTE — Progress Notes (Signed)
Pt's states no medical or surgical changes since previsit or office visit. 

## 2023-05-26 NOTE — Patient Instructions (Signed)
     Handouts on polyps & diverticulosis given to you today.   Await pathology results on polyps removed    Continue previous diet & medications    YOU HAD AN ENDOSCOPIC PROCEDURE TODAY AT THE [facility]:   Refer to the procedure report that was given to you for any specific questions about what was found during the examination.  If the procedure report does not answer your questions, please call your gastroenterologist to clarify.  If you requested that your care partner not be given the details of your procedure findings, then the procedure report has been included in a sealed envelope for you to review at your convenience later.  YOU SHOULD EXPECT: Some feelings of bloating in the abdomen. Passage of more gas than usual.  Walking can help get rid of the air that was put into your GI tract during the procedure and reduce the bloating. If you had a lower endoscopy (such as a colonoscopy or flexible sigmoidoscopy) you may notice spotting of blood in your stool or on the toilet paper. If you underwent a bowel prep for your procedure, you may not have a normal bowel movement for a few days.  Please Note:  You might notice some irritation and congestion in your nose or some drainage.  This is from the oxygen used during your procedure.  There is no need for concern and it should clear up in a day or so.  SYMPTOMS TO REPORT IMMEDIATELY:  Following lower endoscopy (colonoscopy or flexible sigmoidoscopy):  Excessive amounts of blood in the stool  Significant tenderness or worsening of abdominal pains  Swelling of the abdomen that is new, acute  Fever of 100F or higher   For urgent or emergent issues, a gastroenterologist can be reached at any hour by calling [phone]. Do not use MyChart messaging for urgent concerns.    DIET:  We do recommend a small meal at first, but then you may proceed to your regular diet.  Drink plenty of fluids but you should avoid alcoholic beverages  for 24 hours.  ACTIVITY:  You should plan to take it easy for the rest of today and you should NOT DRIVE or use heavy machinery until tomorrow (because of the sedation medicines used during the test).    FOLLOW UP: Our staff will call the number listed on your records the next business day following your procedure.  We will call around 7:15- 8:00 am to check on you and address any questions or concerns that you may have regarding the information given to you following your procedure. If we do not reach you, we will leave a message.     If any biopsies were taken you will be contacted by phone or by letter within the next 1-3 weeks.  Please call us at [phone] if you have not heard about the biopsies in 3 weeks.    SIGNATURES/CONFIDENTIALITY: You and/or your care partner have signed paperwork which will be entered into your electronic medical record.  These signatures attest to the fact that that the information above on your After Visit Summary has been reviewed and is understood.  Full responsibility of the confidentiality of this discharge information lies with you and/or your care-partner.

## 2023-05-26 NOTE — Op Note (Signed)
[facility] Patient Name: Evo Gendron Procedure Date: 05/26/2023 8:11 AM MRN: [id number] Endoscopist: Wilhemina Bonito. Marina Goodell , MD, [id number] Age: 77 Referring MD:  Date of Birth: 12-20-1945 Gender: Male Account #: [id number] Procedure:                Colonoscopy with cold snare polypectomy x 2; biopsy                            polypectomy x 1 Indications:              High risk colon cancer surveillance: Personal                            history of multiple (3 or more) adenomas. Previous                            examinations 2006, 2011, 2016, 2019 Medicines:                Monitored Anesthesia Care Procedure:                Pre-Anesthesia Assessment:                           - Prior to the procedure, a History and Physical                            was performed, and patient medications and                            allergies were reviewed. The patient's tolerance of                            previous anesthesia was also reviewed. The risks                            and benefits of the procedure and the sedation                            options and risks were discussed with the patient.                            All questions were answered, and informed consent                            was obtained. Prior Anticoagulants: The patient has                            taken no anticoagulant or antiplatelet agents. ASA                            Grade Assessment: II - A patient with mild systemic                            disease. After reviewing the risks and benefits,  the patient was deemed in satisfactory condition to                            undergo the procedure.                           After obtaining informed consent, the colonoscope                            was passed under direct vision. Throughout the                            procedure, the patient's blood pressure, pulse, and                            oxygen saturations were  monitored continuously. The                            CF [id number] #[id number] was introduced through the anus                            and advanced to the the cecum, identified by                            appendiceal orifice and ileocecal valve. The                            ileocecal valve, appendiceal orifice, and rectum                            were photographed. The quality of the bowel                            preparation was excellent. The colonoscopy was                            performed without difficulty. The patient tolerated                            the procedure well. The bowel preparation used was                            SUPREP via split dose instruction. Scope In: 8:19:51 AM Scope Out: 8:33:54 AM Scope Withdrawal Time: 0 hours 11 minutes 50 seconds  Total Procedure Duration: 0 hours 14 minutes 3 seconds  Findings:                 Two polyps were found in the rectum and ascending                            colon. The polyps were 3 to 4 mm in size. These                            polyps were removed with a cold  snare. Resection                            and retrieval were complete.                           A 1 mm polyp was found in the transverse colon. The                            polyp was removed with a jumbo cold forceps.                            Resection and retrieval were complete.                           Multiple diverticula were found in the left colon.                           The exam was otherwise without abnormality on                            direct and retroflexion views. Complications:            No immediate complications. Estimated blood loss:                            None. Estimated Blood Loss:     Estimated blood loss: none. Impression:               - Two 3 to 4 mm polyps in the rectum and in the                            ascending colon, removed with a cold snare.                            Resected and retrieved.                            - One 1 mm polyp in the transverse colon, removed                            with a jumbo cold forceps. Resected and retrieved.                           - Diverticulosis in the left colon.                           - The examination was otherwise normal on direct                            and retroflexion views. Recommendation:           - Repeat colonoscopy is not recommended for                            surveillance.                           -  Patient has a contact number available for                            emergencies. The signs and symptoms of potential                            delayed complications were discussed with the                            patient. Return to normal activities tomorrow.                            Written discharge instructions were provided to the                            patient.                           - Resume previous diet.                           - Continue present medications.                           - Await pathology results. Wilhemina Bonito. Marina Goodell, MD 05/26/2023 8:47:53 AM This report has been signed electronically.

## 2023-05-26 NOTE — Progress Notes (Signed)
HISTORY OF PRESENT ILLNESS:  Rodney Martin is a 77 y.o. male with history of multiple adenomatous colon polyps.  Presents today for surveillance colonoscopy.  REVIEW OF SYSTEMS:  All non-GI ROS negative except for  Past Medical History:  Diagnosis Date   Arthritis    COPD (chronic obstructive pulmonary disease) (HCC)    Prostate cancer (HCC)    Psoriatic arthritis (HCC)    Skin cancer    basal cell areas removed, squamoud cell   Wears glasses    Wears hearing aid in both ears     Past Surgical History:  Procedure Laterality Date   CATARACT EXTRACTION Bilateral    COLONOSCOPY  last done 2016   POLYPECTOMY  2016   PROSTATE BIOPSY     RADIOACTIVE SEED IMPLANT N/A 09/25/2020   Procedure: RADIOACTIVE SEED IMPLANT/BRACHYTHERAPY IMPLANT;  Surgeon: Rodney Matar, MD;  Location: [facility];  Service: Urology;  Laterality: N/A;  90 MINS   SKIN CANCER EXCISION     multiple sites   SPACE OAR INSTILLATION N/A 09/25/2020   Procedure: SPACE OAR INSTILLATION;  Surgeon: Rodney Matar, MD;  Location: [facility];  Service: Urology;  Laterality: N/A;   TONSILLECTOMY  age 64    Social History Rodney Martin  reports that he quit smoking about 4 years ago. His smoking use included cigarettes. He started smoking about 64 years ago. He has a 60 pack-year smoking history. He has never used smokeless tobacco. He reports that he does not drink alcohol and does not use drugs.  family history includes Breast cancer in his sister; Colon cancer in his brother; Pancreatic cancer in his brother.  Allergies  Allergen Reactions   Remicade [Infliximab] Anaphylaxis   Aspirin Itching   Humira [Adalimumab] Other (See Comments)    psoriasis   Bacitracin-Polymyxin B Rash   Naproxen Rash    rash   Stelara [Ustekinumab] Rash       PHYSICAL EXAMINATION: Vital signs: BP 122/75   Pulse 78   Temp 98.9 F (37.2 C)   Resp 17   Ht 5\' 10"  (1.778 m)   Wt 190 lb (86.2  kg)   SpO2 96%   BMI 27.26 kg/m  General: Well-developed, well-nourished, no acute distress HEENT: Sclerae are anicteric, conjunctiva pink. Oral mucosa intact Lungs: Clear Heart: Regular Abdomen: soft, nontender, nondistended, no obvious ascites, no peritoneal signs, normal bowel sounds. No organomegaly. Extremities: No edema Psychiatric: alert and oriented x3. Cooperative     ASSESSMENT:  Personal history of multiple adenomatous colon polyps   PLAN:  Surveillance colonoscopy

## 2023-05-27 ENCOUNTER — Telehealth: Payer: Self-pay

## 2023-05-27 NOTE — Telephone Encounter (Signed)
  Follow up Call-     05/26/2023    7:17 AM  Call back number  Post procedure Call Back phone  # [phone]  Permission to leave phone message Yes    Attempted to call patient regarding follow-up phone call. No answer, left VM.

## 2023-05-29 ENCOUNTER — Encounter: Payer: Self-pay | Admitting: Internal Medicine

## 2023-05-29 LAB — SURGICAL PATHOLOGY
